# Patient Record
Sex: Male | Born: 1970 | Race: Black or African American | Hispanic: No | Marital: Single | Smoking: Current every day smoker
Health system: Southern US, Community
[De-identification: ages and names within clinical notes are randomized; demographics above are authoritative.]

## PROBLEM LIST (undated history)

## (undated) ENCOUNTER — Emergency Department (HOSPITAL_COMMUNITY): Payer: Self-pay | Source: Home / Self Care

## (undated) DIAGNOSIS — F1410 Cocaine abuse, uncomplicated: Secondary | ICD-10-CM

## (undated) DIAGNOSIS — F1210 Cannabis abuse, uncomplicated: Secondary | ICD-10-CM

## (undated) DIAGNOSIS — M19012 Primary osteoarthritis, left shoulder: Secondary | ICD-10-CM

## (undated) DIAGNOSIS — F4310 Post-traumatic stress disorder, unspecified: Secondary | ICD-10-CM

## (undated) DIAGNOSIS — F1910 Other psychoactive substance abuse, uncomplicated: Secondary | ICD-10-CM

## (undated) HISTORY — PX: ACHILLES TENDON REPAIR: SUR1153

## (undated) HISTORY — PX: HAND SURGERY: SHX662

---

## 1997-09-23 ENCOUNTER — Ambulatory Visit (HOSPITAL_COMMUNITY): Admission: RE | Admit: 1997-09-23 | Discharge: 1997-09-23 | Payer: Self-pay | Admitting: *Deleted

## 1998-12-23 ENCOUNTER — Ambulatory Visit (HOSPITAL_BASED_OUTPATIENT_CLINIC_OR_DEPARTMENT_OTHER): Admission: RE | Admit: 1998-12-23 | Discharge: 1998-12-23 | Payer: Self-pay | Admitting: Orthopedic Surgery

## 1999-10-18 ENCOUNTER — Ambulatory Visit (HOSPITAL_BASED_OUTPATIENT_CLINIC_OR_DEPARTMENT_OTHER): Admission: RE | Admit: 1999-10-18 | Discharge: 1999-10-19 | Payer: Self-pay | Admitting: Orthopaedic Surgery

## 2000-06-03 ENCOUNTER — Emergency Department (HOSPITAL_COMMUNITY): Admission: EM | Admit: 2000-06-03 | Discharge: 2000-06-03 | Payer: Self-pay | Admitting: Internal Medicine

## 2001-09-06 ENCOUNTER — Emergency Department (HOSPITAL_COMMUNITY): Admission: EM | Admit: 2001-09-06 | Discharge: 2001-09-06 | Payer: Self-pay | Admitting: Emergency Medicine

## 2006-03-27 ENCOUNTER — Emergency Department (HOSPITAL_COMMUNITY): Admission: EM | Admit: 2006-03-27 | Discharge: 2006-03-27 | Payer: Self-pay | Admitting: Emergency Medicine

## 2007-06-22 ENCOUNTER — Emergency Department (HOSPITAL_COMMUNITY): Admission: EM | Admit: 2007-06-22 | Discharge: 2007-06-22 | Payer: Self-pay | Admitting: Emergency Medicine

## 2008-11-30 ENCOUNTER — Observation Stay (HOSPITAL_COMMUNITY): Admission: EM | Admit: 2008-11-30 | Discharge: 2008-12-01 | Payer: Self-pay | Admitting: Emergency Medicine

## 2009-01-09 ENCOUNTER — Ambulatory Visit (HOSPITAL_COMMUNITY): Admission: RE | Admit: 2009-01-09 | Discharge: 2009-01-09 | Payer: Self-pay | Admitting: Otolaryngology

## 2009-01-11 ENCOUNTER — Ambulatory Visit (HOSPITAL_COMMUNITY): Admission: RE | Admit: 2009-01-11 | Discharge: 2009-01-11 | Payer: Self-pay | Admitting: Otolaryngology

## 2010-07-29 LAB — DIFFERENTIAL
Basophils Relative: 1 % (ref 0–1)
Lymphocytes Relative: 40 % (ref 12–46)
Lymphs Abs: 2.3 10*3/uL (ref 0.7–4.0)
Monocytes Absolute: 0.5 10*3/uL (ref 0.1–1.0)
Monocytes Relative: 8 % (ref 3–12)
Neutro Abs: 2.6 10*3/uL (ref 1.7–7.7)
Neutrophils Relative %: 47 % (ref 43–77)

## 2010-07-29 LAB — CBC
Hemoglobin: 14.5 g/dL (ref 13.0–17.0)
RBC: 4.77 MIL/uL (ref 4.22–5.81)
WBC: 5.7 10*3/uL (ref 4.0–10.5)
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
## 2010-09-09 NOTE — Op Note (Signed)
[place]  Patient:    Fred Wood, Fred Wood                        MRN: [id number] Proc. Date: 10/18/99 Adm. Date:  [id number] Attending:  Marcene Corning                           Operative Report  PREOPERATIVE DIAGNOSIS:  Right Achilles tendon rupture.  POSTOPERATIVE DIAGNOSIS:  Right Achilles tendon rupture.  PROCEDURE:  Right Achilles tendon repair.  SURGEON:  Lubertha Basque. Jerl Santos, M.D.  ASSISTANT:  Prince Rome, P.A.  ANESTHESIA:  General.  INDICATIONS:  The patient is a 40 year old man who was playing basketball several weeks ago when he felt a pop in his calf.  He has been unable to jump since that time and has noticed swelling and a defect in his Achilles tendon. He came in several days ago for evaluation several weeks from his injury.  He wished to have this repaired as he had the opposite side repaired last year successfully.  The procedure was discussed with the patient and informed operative consent was obtained after discussion of the possible complications of reaction to anesthesia, infection, repeat rupture, and ankle stiffness.  DESCRIPTION OF PROCEDURE:  The patient was taken to the operating suite where general anesthetic was applied with endotracheal tube without difficulty.  He was positioned prone and prepped and draped in normal sterile fashion.  After administration of preoperative antibiotics, the right leg was elevated, exsanguinated, and a tourniquet inflated about the calf.  A posteromedial longitudinal incision was made and centered over the rupture site.  Dissection was carried down to the peritenon, which was incised.  He had what appeared to be a chronic rupture with some degeneration of the tendon.  It appears that this had partially healed.  The ends of the tendon were freshened sharply with a 15 blade with some degenerative tissue removed, but no more than a couple of millimeters from either end.  The distal  portion was quite thickened.  I then passed a #2 Ethibond suture in [place] fashion through both ends of the tendon to reapproximate this tightly.  I then oversewed the repair with #1 Vicryl. Peritenon was repaired over this tendon with #0 Vicryl in interrupted fashion. The tourniquet was deflated and the foot became pink and warm immediately. Then #2 undyed Vicryl was used to reapproximate the subcutaneous tissues followed by staples in the skin.  Adaptic was placed on the wound followed by dry gauze and posterior splint of plaster just short of neutral.  The estimated blood loss and intraoperative fluids can be obtained from the records as can an adequate tourniquet time.  DISPOSITION:  The patient was extubated in the operating room and taken to the recovery room in stable condition.  Plays were for him to stay overnight for pain control with probable discharge home in the morning. DD:  10/18/99 TD:  10/19/99 Job: [id number]

## 2010-09-28 ENCOUNTER — Inpatient Hospital Stay (INDEPENDENT_AMBULATORY_CARE_PROVIDER_SITE_OTHER)
Admission: RE | Admit: 2010-09-28 | Discharge: 2010-09-28 | Disposition: A | Discharge status: Home / Self Care | Payer: 59 | Source: Ambulatory Visit | Attending: Family Medicine | Admitting: Family Medicine

## 2010-09-28 DIAGNOSIS — L989 Disorder of the skin and subcutaneous tissue, unspecified: Secondary | ICD-10-CM

## 2010-09-30 LAB — HERPES SIMPLEX VIRUS CULTURE: Culture: NOT DETECTED

## 2010-10-01 ENCOUNTER — Inpatient Hospital Stay (INDEPENDENT_AMBULATORY_CARE_PROVIDER_SITE_OTHER)
Admission: RE | Admit: 2010-10-01 | Discharge: 2010-10-01 | Disposition: A | Discharge status: Home / Self Care | Payer: 59 | Source: Ambulatory Visit | Attending: Emergency Medicine | Admitting: Emergency Medicine

## 2010-10-01 DIAGNOSIS — K120 Recurrent oral aphthae: Secondary | ICD-10-CM

## 2010-10-01 LAB — RPR: RPR Ser Ql: NONREACTIVE

## 2010-10-01 LAB — HIV ANTIBODY (ROUTINE TESTING W REFLEX): HIV: NONREACTIVE

## 2012-02-13 ENCOUNTER — Emergency Department (HOSPITAL_COMMUNITY)
Admission: EM | Admit: 2012-02-13 | Discharge: 2012-02-13 | Disposition: A | Discharge status: Home / Self Care | Payer: Self-pay | Attending: Emergency Medicine | Admitting: Emergency Medicine

## 2012-02-13 ENCOUNTER — Encounter (HOSPITAL_COMMUNITY): Payer: Self-pay | Admitting: *Deleted

## 2012-02-13 DIAGNOSIS — K047 Periapical abscess without sinus: Secondary | ICD-10-CM | POA: Insufficient documentation

## 2012-02-13 DIAGNOSIS — F17200 Nicotine dependence, unspecified, uncomplicated: Secondary | ICD-10-CM | POA: Insufficient documentation

## 2012-02-13 MED ORDER — OXYCODONE-ACETAMINOPHEN 5-325 MG PO TABS
1.0000 | ORAL_TABLET | Freq: Once | ORAL | Status: AC
  Administered 2012-02-13: 1 via ORAL
  Filled 2012-02-13: qty 1

## 2012-02-13 MED ORDER — PENICILLIN V POTASSIUM 500 MG PO TABS: 500.0000 mg | ORAL_TABLET | Freq: Three times a day (TID) | ORAL | Status: DC

## 2012-02-13 MED ORDER — HYDROCODONE-ACETAMINOPHEN 5-500 MG PO TABS: 1.0000 | ORAL_TABLET | Freq: Four times a day (QID) | ORAL | Status: DC | PRN

## 2012-02-13 MED ORDER — PENICILLIN V POTASSIUM 500 MG PO TABS
500.0000 mg | ORAL_TABLET | Freq: Once | ORAL | Status: AC
  Administered 2012-02-13: 500 mg via ORAL
  Filled 2012-02-13: qty 1
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
## 2012-02-13 NOTE — ED Notes (Signed)
Rt sided tooth pain with swelling

## 2012-02-16 NOTE — ED Provider Notes (Signed)
Medical screening examination/treatment/procedure(s) were performed by non-physician practitioner and as supervising physician I was immediately available for consultation/collaboration.   Laray Anger, DO 02/16/12 Prentice Docker

## 2013-02-23 ENCOUNTER — Emergency Department (INDEPENDENT_AMBULATORY_CARE_PROVIDER_SITE_OTHER)
Admission: EM | Admit: 2013-02-23 | Discharge: 2013-02-23 | Disposition: A | Discharge status: Home / Self Care | Payer: Self-pay | Source: Home / Self Care | Attending: Family Medicine | Admitting: Family Medicine

## 2013-02-23 ENCOUNTER — Encounter (HOSPITAL_COMMUNITY): Payer: Self-pay | Admitting: Emergency Medicine

## 2013-02-23 DIAGNOSIS — M25519 Pain in unspecified shoulder: Secondary | ICD-10-CM

## 2013-02-23 DIAGNOSIS — M25511 Pain in right shoulder: Secondary | ICD-10-CM
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
## 2014-09-07 ENCOUNTER — Ambulatory Visit (INDEPENDENT_AMBULATORY_CARE_PROVIDER_SITE_OTHER): Payer: 59

## 2014-09-07 ENCOUNTER — Ambulatory Visit (INDEPENDENT_AMBULATORY_CARE_PROVIDER_SITE_OTHER): Payer: 59 | Admitting: Family Medicine

## 2014-09-07 VITALS — BP 122/90 | HR 82 | Temp 99.6°F | Resp 18 | Ht 68.0 in | Wt 202.0 lb

## 2014-09-07 DIAGNOSIS — H1132 Conjunctival hemorrhage, left eye: Secondary | ICD-10-CM

## 2014-09-07 DIAGNOSIS — S20219A Contusion of unspecified front wall of thorax, initial encounter: Secondary | ICD-10-CM | POA: Diagnosis not present

## 2014-09-07 DIAGNOSIS — H5712 Ocular pain, left eye: Secondary | ICD-10-CM

## 2014-09-07 DIAGNOSIS — R519 Headache, unspecified: Secondary | ICD-10-CM

## 2014-09-07 DIAGNOSIS — R0781 Pleurodynia: Secondary | ICD-10-CM

## 2014-09-07 DIAGNOSIS — S0083XA Contusion of other part of head, initial encounter: Secondary | ICD-10-CM | POA: Diagnosis not present

## 2014-09-07 DIAGNOSIS — R51 Headache: Secondary | ICD-10-CM

## 2014-09-07 DIAGNOSIS — R071 Chest pain on breathing: Secondary | ICD-10-CM

## 2014-09-07 MED ORDER — OXYCODONE-ACETAMINOPHEN 5-325 MG PO TABS: 1.0000 | ORAL_TABLET | Freq: Three times a day (TID) | ORAL | Status: DC | PRN

## 2014-09-07 MED ORDER — IBUPROFEN 600 MG PO TABS: 600.0000 mg | ORAL_TABLET | Freq: Three times a day (TID) | ORAL | Status: DC | PRN

## 2014-09-07 MED ORDER — CYCLOBENZAPRINE HCL 5 MG PO TABS: 5.0000 mg | ORAL_TABLET | Freq: Three times a day (TID) | ORAL | Status: DC | PRN
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
## 2014-09-07 NOTE — Patient Instructions (Signed)

## 2015-10-24 ENCOUNTER — Emergency Department (HOSPITAL_COMMUNITY)
Admission: EM | Admit: 2015-10-24 | Discharge: 2015-10-24 | Disposition: A | Discharge status: Home / Self Care | Payer: Non-veteran care | Attending: Emergency Medicine | Admitting: Emergency Medicine

## 2015-10-24 ENCOUNTER — Encounter (HOSPITAL_COMMUNITY): Payer: Self-pay | Admitting: Emergency Medicine

## 2015-10-24 DIAGNOSIS — F10129 Alcohol abuse with intoxication, unspecified: Secondary | ICD-10-CM | POA: Insufficient documentation

## 2015-10-24 DIAGNOSIS — F17210 Nicotine dependence, cigarettes, uncomplicated: Secondary | ICD-10-CM | POA: Diagnosis not present

## 2015-10-24 DIAGNOSIS — Z79899 Other long term (current) drug therapy: Secondary | ICD-10-CM | POA: Insufficient documentation

## 2015-10-24 DIAGNOSIS — F10920 Alcohol use, unspecified with intoxication, uncomplicated: Secondary | ICD-10-CM

## 2015-10-24 DIAGNOSIS — Z791 Long term (current) use of non-steroidal anti-inflammatories (NSAID): Secondary | ICD-10-CM | POA: Diagnosis not present

## 2015-10-24 DIAGNOSIS — R4689 Other symptoms and signs involving appearance and behavior: Secondary | ICD-10-CM

## 2015-10-24 NOTE — Discharge Instructions (Signed)
Alcohol Intoxication  Alcohol intoxication occurs when the amount of alcohol that a person has consumed impairs his or her ability to mentally and physically function. Alcohol directly impairs the normal chemical activity of the brain. Drinking large amounts of alcohol can lead to changes in mental function and behavior, and it can cause many physical effects that can be harmful.   Alcohol intoxication can range in severity from mild to very severe. Various factors can affect the level of intoxication that occurs, such as the person's age, gender, weight, frequency of alcohol consumption, and the presence of other medical conditions (such as diabetes, seizures, or heart conditions). Dangerous levels of alcohol intoxication may occur when people drink large amounts of alcohol in a short period (binge drinking). Alcohol can also be especially dangerous when combined with certain prescription medicines or "recreational" drugs.  SIGNS AND SYMPTOMS  Some common signs and symptoms of mild alcohol intoxication include:  · Loss of coordination.  · Changes in mood and behavior.  · Impaired judgment.  · Slurred speech.  As alcohol intoxication progresses to more severe levels, other signs and symptoms will appear. These may include:  · Vomiting.  · Confusion and impaired memory.  · Slowed breathing.  · Seizures.  · Loss of consciousness.  DIAGNOSIS   Your health care provider will take a medical history and perform a physical exam. You will be asked about the amount and type of alcohol you have consumed. Blood tests will be done to measure the concentration of alcohol in your blood. In many places, your blood alcohol level must be lower than 80 mg/dL (0.08%) to legally drive. However, many dangerous effects of alcohol can occur at much lower levels.   TREATMENT   People with alcohol intoxication often do not require treatment. Most of the effects of alcohol intoxication are temporary, and they go away as the alcohol naturally  leaves the body. Your health care provider will monitor your condition until you are stable enough to go home. Fluids are sometimes given through an IV access tube to help prevent dehydration.   HOME CARE INSTRUCTIONS  · Do not drive after drinking alcohol.  · Stay hydrated. Drink enough water and fluids to keep your urine clear or pale yellow. Avoid caffeine.    · Only take over-the-counter or prescription medicines as directed by your health care provider.    SEEK MEDICAL CARE IF:   · You have persistent vomiting.    · You do not feel better after a few days.  · You have frequent alcohol intoxication. Your health care provider can help determine if you should see a substance use treatment counselor.  SEEK IMMEDIATE MEDICAL CARE IF:   · You become shaky or tremble when you try to stop drinking.    · You shake uncontrollably (seizure).    · You throw up (vomit) blood. This may be bright red or may look like black coffee grounds.    · You have blood in your stool. This may be bright red or may appear as a black, tarry, bad smelling stool.    · You become lightheaded or faint.    MAKE SURE YOU:   · Understand these instructions.  · Will watch your condition.  · Will get help right away if you are not doing well or get worse.     This information is not intended to replace advice given to you by your health care provider. Make sure you discuss any questions you have with your health care provider.       Document Released: 01/18/2005 Document Revised: 12/11/2012 Document Reviewed: 09/13/2012  Elsevier Interactive Patient Education ©2016 [employer]

## 2015-10-24 NOTE — ED Notes (Signed)
Patient d/c'd self care.  Reviewed d/c papers.  Patient verbalized understanding.
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
## 2017-01-13 ENCOUNTER — Encounter (HOSPITAL_COMMUNITY): Payer: Self-pay

## 2017-01-13 ENCOUNTER — Emergency Department (HOSPITAL_COMMUNITY)
Admission: EM | Admit: 2017-01-13 | Discharge: 2017-01-15 | Disposition: A | Discharge status: Home / Self Care | Payer: Self-pay | Attending: Emergency Medicine | Admitting: Emergency Medicine

## 2017-01-13 DIAGNOSIS — F17210 Nicotine dependence, cigarettes, uncomplicated: Secondary | ICD-10-CM | POA: Insufficient documentation

## 2017-01-13 DIAGNOSIS — F1910 Other psychoactive substance abuse, uncomplicated: Secondary | ICD-10-CM

## 2017-01-13 DIAGNOSIS — F1010 Alcohol abuse, uncomplicated: Secondary | ICD-10-CM | POA: Insufficient documentation

## 2017-01-13 DIAGNOSIS — F12922 Cannabis use, unspecified with intoxication with perceptual disturbance: Secondary | ICD-10-CM

## 2017-01-13 DIAGNOSIS — F22 Delusional disorders: Secondary | ICD-10-CM

## 2017-01-13 DIAGNOSIS — F14150 Cocaine abuse with cocaine-induced psychotic disorder with delusions: Secondary | ICD-10-CM | POA: Insufficient documentation

## 2017-01-13 DIAGNOSIS — F1290 Cannabis use, unspecified, uncomplicated: Secondary | ICD-10-CM | POA: Insufficient documentation

## 2017-01-13 HISTORY — DX: Cannabis abuse, uncomplicated: F12.10

## 2017-01-13 HISTORY — DX: Other psychoactive substance abuse, uncomplicated: F19.10

## 2017-01-13 HISTORY — DX: Cocaine abuse, uncomplicated: F14.10

## 2017-01-13 HISTORY — DX: Post-traumatic stress disorder, unspecified: F43.10

## 2017-01-13 HISTORY — DX: Primary osteoarthritis, left shoulder: M19.012

## 2017-01-13 LAB — RAPID URINE DRUG SCREEN, HOSP PERFORMED
Amphetamines: NOT DETECTED
BENZODIAZEPINES: NOT DETECTED
Barbiturates: NOT DETECTED
COCAINE: POSITIVE — AB
OPIATES: NOT DETECTED
Tetrahydrocannabinol: POSITIVE — AB

## 2017-01-13 LAB — COMPREHENSIVE METABOLIC PANEL
ALK PHOS: 46 U/L (ref 38–126)
ALT: 17 U/L (ref 17–63)
AST: 23 U/L (ref 15–41)
Albumin: 4.1 g/dL (ref 3.5–5.0)
Anion gap: 10 (ref 5–15)
BUN: 6 mg/dL (ref 6–20)
CALCIUM: 9.1 mg/dL (ref 8.9–10.3)
CO2: 27 mmol/L (ref 22–32)
CREATININE: 1.38 mg/dL — AB (ref 0.61–1.24)
Chloride: 99 mmol/L — ABNORMAL LOW (ref 101–111)
Glucose, Bld: 84 mg/dL (ref 65–99)
Potassium: 3.7 mmol/L (ref 3.5–5.1)
Sodium: 136 mmol/L (ref 135–145)
Total Bilirubin: 1 mg/dL (ref 0.3–1.2)
Total Protein: 7.4 g/dL (ref 6.5–8.1)

## 2017-01-13 LAB — ETHANOL

## 2017-01-13 LAB — CBC
HCT: 50.5 % (ref 39.0–52.0)
Hemoglobin: 17.3 g/dL — ABNORMAL HIGH (ref 13.0–17.0)
MCH: 29.9 pg (ref 26.0–34.0)
MCHC: 34.3 g/dL (ref 30.0–36.0)
MCV: 87.4 fL (ref 78.0–100.0)
PLATELETS: 345 10*3/uL (ref 150–400)
RBC: 5.78 MIL/uL (ref 4.22–5.81)
RDW: 15 % (ref 11.5–15.5)
WBC: 8 10*3/uL (ref 4.0–10.5)

## 2017-01-13 MED ORDER — ONDANSETRON HCL 4 MG PO TABS: 4.0000 mg | ORAL_TABLET | Freq: Three times a day (TID) | ORAL | Status: DC | PRN

## 2017-01-13 MED ORDER — ZOLPIDEM TARTRATE 5 MG PO TABS: 5.0000 mg | ORAL_TABLET | Freq: Every evening | ORAL | Status: DC | PRN

## 2017-01-13 MED ORDER — ALUM & MAG HYDROXIDE-SIMETH 200-200-20 MG/5ML PO SUSP: 30.0000 mL | Freq: Four times a day (QID) | ORAL | Status: DC | PRN

## 2017-01-13 MED ORDER — LORAZEPAM 1 MG PO TABS
1.0000 mg | ORAL_TABLET | Freq: Once | ORAL | Status: AC
  Administered 2017-01-13: 1 mg via ORAL
  Filled 2017-01-13: qty 1

## 2017-01-13 MED ORDER — NICOTINE 21 MG/24HR TD PT24
21.0000 mg | MEDICATED_PATCH | Freq: Every day | TRANSDERMAL | Status: DC
  Administered 2017-01-14: 21 mg via TRANSDERMAL
  Filled 2017-01-13: qty 1

## 2017-01-13 MED ORDER — ACETAMINOPHEN 325 MG PO TABS
650.0000 mg | ORAL_TABLET | ORAL | Status: DC | PRN
  Administered 2017-01-14: 650 mg via ORAL
  Filled 2017-01-13: qty 2

## 2017-01-13 MED ORDER — LORAZEPAM 1 MG PO TABS
1.0000 mg | ORAL_TABLET | ORAL | Status: DC | PRN
Start: 2017-01-13 — End: 2017-01-15

## 2017-01-13 MED ORDER — ZIPRASIDONE MESYLATE 20 MG IM SOLR: 20.0000 mg | Freq: Two times a day (BID) | INTRAMUSCULAR | Status: DC | PRN
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
## 2017-01-13 NOTE — ED Notes (Addendum)
Pt moved to [facility]; staffing called for sitter. Pt awakened briefly to ambulate from stretcher to room.  Pt followed commands for  VS.  When asked if he was having tremors, he said he didn't know, maybe yes.  When this RN attempted to continue CIWA/COWS assessment, pt went back to sleep.
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
## 2017-01-13 NOTE — Discharge Instructions (Addendum)
Substance Use Disorder Substance use disorder happens when a person's repeated use of drugs or alcohol interferes with his or her ability to be productive. This disorder can cause problems with your mental and physical health. It can affect your ability to have healthy relationships, and it can keep you from being able to meet your responsibilities at work, home, or school. It can also lead to addiction. The most commonly abused substances include:  Alcohol.  Tobacco.  Marijuana.  Stimulants, such as cocaine and methamphetamine.  Hallucinogens, such as LSD and PCP.  Opioids, such as some prescription pain medicines and heroin.  What are the causes? This condition may develop due to social, psychological, or physical reasons. Causes include:  Stress.  Abuse.  Peer pressure.  Anxiety.  Depression.  What increases the risk? This condition is more likely to develop in people who:  Are stressed.  Have been abused.  Have a mental health disorder, such as depression.  Are born with certain genes.  What are the signs or symptoms? Symptoms of this condition include:  Using the substance for longer periods of time or at a higher dosage than what is normal or intended.  Having a lasting desire to use the substance.  Being unable to slow down or stop your use of the substance.  Spending an abnormal amount of time seeking the substance, using the substance, or recovering from using the substance.  Craving the substance.  Substance use that: ? Interferes with your work, school, or home life. ? Interferes with your personal and social relationships. ? Makes you give up activities that you once enjoyed or found important.  Using the substance even though: ? You know it is dangerous or bad for your health. ? You know it is causing problems in your life.  Needing more and more of the substance to get the same effect (developing tolerance).  Experiencing physical symptoms  if you do not use the substance (withdrawal).  Using the substance to avoid withdrawal.  How is this diagnosed? This condition may be diagnosed based on:  Your history of substance use.  The way in which substance abuse affects your life.  Having at least two symptoms of substance use disorder within a 68-month period.  Your health care provider may also test your blood and urine for alcohol and drugs. How is this treated? This condition may be treated by:  Stopping substance use safely. This may require taking medicines and being closely observed for several days.  Taking part in group and individual counseling from mental health providers who help people with substance use disorder.  Staying at a residential treatment center for several days or weeks.  Attending daily counseling sessions at a treatment center.  Taking medicine as told by your health care provider: ? To ease symptoms and prevent complications during withdrawal. ? To treat other mental health issues, such as depression or anxiety. ? To block cravings by causing the same effects as the substance. ? To block the effects of the substance or replace good sensations with unpleasant ones.  Going to a support group to share your experience with others who are going through the same thing. These groups are an important part of long-term recovery for many people. They include 12-step programs like Alcoholics Anonymous and Narcotics Anonymous.  Recovery can be a long process. Many people who undergo treatment start using the substance again after stopping. This is called a relapse. If you have a relapse, that does not mean  that treatment will not work. Follow these instructions at home:  Take over-the-counter and prescription medicines only as told by your health care provider.  Do not use any drugs or alcohol.  Keep all follow-up visits as told by your health care provider. This is important. This includes continuing  to work with therapists, health care providers, and support groups. Contact a health care provider if:  You cannot take your medicines as told.  Your symptoms get worse.  You have trouble resisting the urge to use drugs or alcohol. Get help right away if:  You have serious thoughts about hurting yourself or someone else.  You have a relapse. This information is not intended to replace advice given to you by your health care provider. Make sure you discuss any questions you have with your health care provider. Document Released: 11/30/2004 Document Revised: 12/08/2015 Document Reviewed: 04/15/2014 Elsevier Interactive Patient Education  2018 [employer].  Substance Abuse Testing Why am I having this test? Substance abuse testing is done to identify the presence of drugs in the body. It may also be done to:  Help guide treatment if you have seizures.  Measure the levels of certain medicines in your body, including anabolic steroids, stimulants, diuretics, and lithium.  Substance abuse testing is most often used by employers or Patent examiner agencies to identify whether a person has used illegal drugs, such as cocaine, amphetamines, and marijuana. What kind of sample is taken? Your health care provider may collect at least one of the following to perform the test:  Urine. A urine sample is collected in a sterile container given to you by the lab. If you are asked to provide a urine sample, do not alter it in any way.  Hair. A sample of hair requires cutting 50 strands of hair from your scalp.  Blood. A blood sample is usually collected by inserting a needle into a vein.  How do I prepare for this test? You may be asked to provide a list of your current prescription medicines. If the test is required for employment or legal reasons, you will be asked to give consent for the test. There is no other preparation required. How are the results reported? Your test results will be  reported as either positive or negative. It may be your responsibility to obtain your test results. Ask the lab or department performing the test when and how you will get your results. What do the results mean? A negative result means no drugs were found. A positive result may mean you have recently used drugs. If your result is positive, more testing will be done to confirm the presence of drugs. Talk with your health care provider to discuss your results, treatment options, and if necessary, the need for more tests. Talk with your health care provider if you have any questions about your results. This information is not intended to replace advice given to you by your health care provider. Make sure you discuss any questions you have with your health care provider. Document Released: 04/10/2005 Document Revised: 12/08/2015 Document Reviewed: 09/05/2013 Elsevier Interactive Patient Education  [employer].

## 2017-01-14 ENCOUNTER — Encounter (HOSPITAL_COMMUNITY): Payer: Self-pay | Admitting: *Deleted

## 2017-01-14 NOTE — ED Notes (Signed)
Offered pt to shower x 2 - declined. Lying on bed watching tv.

## 2017-01-14 NOTE — BH Assessment (Signed)
Reassessment: When prompted about what brought him to the ED patient stated "I told them yesterday, they didn't write that down!" Patient presenting irritable during assessment. Patient denies SI, HI and AVH at this time. Clinician explained to patient that inpatient was recommended for him at this time and Disposition was seeking a bed with the [place]. Patient stated "I don't want to go to inpatient." When asked what he would like patient stated "I just want to get help for my hallucinations." Clinician explained that the best place to stabilize his medications and reduce recurrence of hallucinations was inpatient. Patient stated "I don't want it." Clinician stated she would share his concerns with the NP.   Nira Retort, MSW, LCSW Triage Specialist [phone],

## 2017-01-14 NOTE — ED Notes (Signed)
Medical Clearance Pt Policy form given to pt - voiced understanding. Pt aware his friend, Jasmine December, advised she is coming to visit at 1230.

## 2017-01-14 NOTE — ED Notes (Signed)
Pt's friend, Jasmine December, visiting w/pt.

## 2017-01-14 NOTE — ED Notes (Signed)
Dallie Piles, RN, Consulting civil engineer aware no sitter available. RN observing.

## 2017-01-14 NOTE — ED Notes (Signed)
Pt asking for door to be closed - advised pt unable to do so for safety. Voiced understanding.

## 2017-01-14 NOTE — ED Notes (Signed)
IVC paperwork completed by Dr Juleen China - faxed to [facility] - called and verified receipt.
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
## 2017-01-14 NOTE — ED Notes (Signed)
Copy of IVC papers faxed to [facility], copy sent to Medical Records, original placed in folder for Magistrate, and all 3 sets on clipboard.  

## 2017-01-14 NOTE — ED Notes (Signed)
Pt's friend leaving at this time. Requests for pt to receive double portions d/t "he is an eater and fixes himself full course meals w/no leftovers".

## 2017-01-14 NOTE — ED Notes (Addendum)
Pt's friend, Jasmine December, called - advised her pt is sleeping at this time and offered to advise him of phone call when he awakens - advised she will visit pt at 1230-1300 visitation time.

## 2017-01-14 NOTE — ED Notes (Signed)
Pt's friend, Jasmine December, leaving at this time - wrote numbers pt had requested on a piece of paper - placed on bedside table for pt. Pt asking if his cell phone and keys may be left in ED - recommended for pt to allow Jasmine December to keep so no risk of getting lost. Both voiced agreement.

## 2017-01-14 NOTE — ED Notes (Signed)
Pt had coughing spell.  Given drink by sitter.

## 2017-01-15 DIAGNOSIS — F1910 Other psychoactive substance abuse, uncomplicated: Secondary | ICD-10-CM

## 2017-01-15 DIAGNOSIS — F17210 Nicotine dependence, cigarettes, uncomplicated: Secondary | ICD-10-CM

## 2017-01-15 DIAGNOSIS — F12922 Cannabis use, unspecified with intoxication with perceptual disturbance: Secondary | ICD-10-CM
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
## 2017-01-15 NOTE — ED Notes (Signed)
Pt stat he understands instructions and will return for any worsening of symptoms or new problems. Ome stable with steady gait.

## 2017-01-15 NOTE — ED Notes (Signed)
TTS in progress 

## 2017-01-15 NOTE — Progress Notes (Signed)
Per Fransisca Kaufmann, NP, the patient does not meet criteria for inpatient treatment. The patient is recommended for discharge and to continue to follow up with his psychiatrist with [facility].   The patient is Psych Cleared.   Patient reports he has an appointment with his psychiatrist on January 23, 2017.    Ermalinda Memos, RN notified.      Baldo Daub MSW, LCSWA CSW Disposition [phone]

## 2017-01-15 NOTE — ED Notes (Signed)
Pt statse to hold nicotene patch till after shower.

## 2017-01-15 NOTE — ED Notes (Signed)
Confirmed through courthouse supervisor that Notice of Commitment change arrived at [employer].

## 2019-11-10 ENCOUNTER — Encounter (HOSPITAL_COMMUNITY): Payer: Self-pay | Admitting: *Deleted

## 2019-11-10 ENCOUNTER — Inpatient Hospital Stay (HOSPITAL_COMMUNITY): Payer: No Typology Code available for payment source

## 2019-11-10 ENCOUNTER — Emergency Department (HOSPITAL_COMMUNITY): Payer: No Typology Code available for payment source

## 2019-11-10 ENCOUNTER — Inpatient Hospital Stay (HOSPITAL_COMMUNITY)
Admission: EM | Admit: 2019-11-10 | Discharge: 2019-11-12 | DRG: 917 | Disposition: A | Discharge status: Home / Self Care | Payer: No Typology Code available for payment source | Attending: Internal Medicine | Admitting: Internal Medicine

## 2019-11-10 ENCOUNTER — Other Ambulatory Visit: Payer: Self-pay

## 2019-11-10 DIAGNOSIS — E785 Hyperlipidemia, unspecified: Secondary | ICD-10-CM | POA: Diagnosis present

## 2019-11-10 DIAGNOSIS — I6381 Other cerebral infarction due to occlusion or stenosis of small artery: Secondary | ICD-10-CM | POA: Diagnosis present

## 2019-11-10 DIAGNOSIS — F17210 Nicotine dependence, cigarettes, uncomplicated: Secondary | ICD-10-CM | POA: Diagnosis present

## 2019-11-10 DIAGNOSIS — M19012 Primary osteoarthritis, left shoulder: Secondary | ICD-10-CM | POA: Diagnosis present

## 2019-11-10 DIAGNOSIS — F1410 Cocaine abuse, uncomplicated: Secondary | ICD-10-CM | POA: Diagnosis present

## 2019-11-10 DIAGNOSIS — F1910 Other psychoactive substance abuse, uncomplicated: Secondary | ICD-10-CM

## 2019-11-10 DIAGNOSIS — F4310 Post-traumatic stress disorder, unspecified: Secondary | ICD-10-CM | POA: Diagnosis present

## 2019-11-10 DIAGNOSIS — G8194 Hemiplegia, unspecified affecting left nondominant side: Secondary | ICD-10-CM | POA: Diagnosis present

## 2019-11-10 DIAGNOSIS — T405X1A Poisoning by cocaine, accidental (unintentional), initial encounter: Secondary | ICD-10-CM | POA: Diagnosis present

## 2019-11-10 DIAGNOSIS — F1210 Cannabis abuse, uncomplicated: Secondary | ICD-10-CM | POA: Diagnosis present

## 2019-11-10 DIAGNOSIS — I6389 Other cerebral infarction: Secondary | ICD-10-CM | POA: Diagnosis not present

## 2019-11-10 DIAGNOSIS — Z20822 Contact with and (suspected) exposure to covid-19: Secondary | ICD-10-CM | POA: Diagnosis present

## 2019-11-10 DIAGNOSIS — I639 Cerebral infarction, unspecified: Secondary | ICD-10-CM

## 2019-11-10 DIAGNOSIS — G43109 Migraine with aura, not intractable, without status migrainosus: Secondary | ICD-10-CM | POA: Diagnosis not present

## 2019-11-10 DIAGNOSIS — R7303 Prediabetes: Secondary | ICD-10-CM | POA: Diagnosis present

## 2019-11-10 LAB — I-STAT CHEM 8, ED
BUN: 9 mg/dL (ref 6–20)
Calcium, Ion: 1.06 mmol/L — ABNORMAL LOW (ref 1.15–1.40)
Chloride: 105 mmol/L (ref 98–111)
Creatinine, Ser: 1 mg/dL (ref 0.61–1.24)
Glucose, Bld: 131 mg/dL — ABNORMAL HIGH (ref 70–99)
HCT: 47 % (ref 39.0–52.0)
Hemoglobin: 16 g/dL (ref 13.0–17.0)
Potassium: 3.7 mmol/L (ref 3.5–5.1)
Sodium: 140 mmol/L (ref 135–145)
TCO2: 26 mmol/L (ref 22–32)

## 2019-11-10 LAB — DIFFERENTIAL
Abs Immature Granulocytes: 0.07 10*3/uL (ref 0.00–0.07)
Basophils Absolute: 0.1 10*3/uL (ref 0.0–0.1)
Basophils Relative: 1 %
Eosinophils Absolute: 0.2 10*3/uL (ref 0.0–0.5)
Eosinophils Relative: 2 %
Immature Granulocytes: 1 %
Lymphocytes Relative: 29 %
Lymphs Abs: 2.4 10*3/uL (ref 0.7–4.0)
Monocytes Absolute: 0.7 10*3/uL (ref 0.1–1.0)
Monocytes Relative: 8 %
Neutro Abs: 5 10*3/uL (ref 1.7–7.7)
Neutrophils Relative %: 59 %

## 2019-11-10 LAB — CBC
HCT: 46.9 % (ref 39.0–52.0)
Hemoglobin: 14.8 g/dL (ref 13.0–17.0)
MCH: 28.5 pg (ref 26.0–34.0)
MCHC: 31.6 g/dL (ref 30.0–36.0)
MCV: 90.2 fL (ref 80.0–100.0)
Platelets: 350 10*3/uL (ref 150–400)
RBC: 5.2 MIL/uL (ref 4.22–5.81)
RDW: 15.9 % — ABNORMAL HIGH (ref 11.5–15.5)
WBC: 8.4 10*3/uL (ref 4.0–10.5)
nRBC: 0 % (ref 0.0–0.2)

## 2019-11-10 LAB — COMPREHENSIVE METABOLIC PANEL
ALT: 15 U/L (ref 0–44)
AST: 20 U/L (ref 15–41)
Albumin: 3.4 g/dL — ABNORMAL LOW (ref 3.5–5.0)
Alkaline Phosphatase: 47 U/L (ref 38–126)
Anion gap: 8 (ref 5–15)
BUN: 9 mg/dL (ref 6–20)
CO2: 25 mmol/L (ref 22–32)
Calcium: 8.4 mg/dL — ABNORMAL LOW (ref 8.9–10.3)
Chloride: 106 mmol/L (ref 98–111)
Creatinine, Ser: 1.06 mg/dL (ref 0.61–1.24)
GFR calc Af Amer: >60 mL/min (ref >60)
GFR calc non Af Amer: >60 mL/min (ref >60)
Glucose, Bld: 130 mg/dL — ABNORMAL HIGH (ref 70–99)
Potassium: 3.8 mmol/L (ref 3.5–5.1)
Sodium: 139 mmol/L (ref 135–145)
Total Bilirubin: 0.8 mg/dL (ref 0.3–1.2)
Total Protein: 6.7 g/dL (ref 6.5–8.1)

## 2019-11-10 LAB — APTT: aPTT: 26 seconds (ref 24–36)

## 2019-11-10 LAB — PROTIME-INR
INR: 1 (ref 0.8–1.2)
Prothrombin Time: 12.3 seconds (ref 11.4–15.2)

## 2019-11-10 LAB — CBG MONITORING, ED: Glucose-Capillary: 120 mg/dL — ABNORMAL HIGH (ref 70–99)

## 2019-11-10 MED ORDER — NAPHAZOLINE-PHENIRAMINE 0.025-0.3 % OP SOLN
1.0000 [drp] | Freq: Four times a day (QID) | OPHTHALMIC | Status: DC | PRN
  Filled 2019-11-10: qty 15

## 2019-11-10 MED ORDER — ACETAMINOPHEN 325 MG PO TABS
650.0000 mg | ORAL_TABLET | ORAL | Status: DC | PRN
  Administered 2019-11-12: 650 mg via ORAL
  Filled 2019-11-10: qty 2

## 2019-11-10 MED ORDER — ACETAMINOPHEN 650 MG RE SUPP: 650.0000 mg | RECTAL | Status: DC | PRN

## 2019-11-10 MED ORDER — SODIUM CHLORIDE 0.9% FLUSH: 3.0000 mL | Freq: Once | INTRAVENOUS | Status: DC

## 2019-11-10 MED ORDER — ACETAMINOPHEN 160 MG/5ML PO SOLN: 650.0000 mg | ORAL | Status: DC | PRN

## 2019-11-10 MED ORDER — METOCLOPRAMIDE HCL 5 MG/ML IJ SOLN
10.0000 mg | Freq: Once | INTRAMUSCULAR | Status: AC
  Administered 2019-11-10: 10 mg via INTRAVENOUS
  Filled 2019-11-10: qty 2

## 2019-11-10 MED ORDER — ASPIRIN 81 MG PO CHEW: 324.0000 mg | CHEWABLE_TABLET | Freq: Every day | ORAL | Status: DC

## 2019-11-10 MED ORDER — SODIUM CHLORIDE 0.9 % IV SOLN: INTRAVENOUS | Status: DC

## 2019-11-10 MED ORDER — HEPARIN SODIUM (PORCINE) 5000 UNIT/ML IJ SOLN
5000.0000 [IU] | Freq: Three times a day (TID) | INTRAMUSCULAR | Status: DC
  Administered 2019-11-10 – 2019-11-12 (×5): 5000 [IU] via SUBCUTANEOUS
  Filled 2019-11-10 (×5): qty 1

## 2019-11-10 MED ORDER — CLOPIDOGREL BISULFATE 300 MG PO TABS
300.0000 mg | ORAL_TABLET | Freq: Once | ORAL | Status: AC
  Administered 2019-11-10: 300 mg via ORAL
  Filled 2019-11-10: qty 1

## 2019-11-10 MED ORDER — PANTOPRAZOLE SODIUM 40 MG PO TBEC
40.0000 mg | DELAYED_RELEASE_TABLET | Freq: Every day | ORAL | Status: DC
  Administered 2019-11-11 – 2019-11-12 (×2): 40 mg via ORAL
  Filled 2019-11-10 (×2): qty 1

## 2019-11-10 MED ORDER — ASPIRIN EC 81 MG PO TBEC
81.0000 mg | DELAYED_RELEASE_TABLET | Freq: Every day | ORAL | Status: DC
  Administered 2019-11-11 – 2019-11-12 (×2): 81 mg via ORAL
  Filled 2019-11-10 (×3): qty 1

## 2019-11-10 MED ORDER — IOHEXOL 350 MG/ML SOLN
75.0000 mL | Freq: Once | INTRAVENOUS | Status: AC | PRN
  Administered 2019-11-10: 75 mL via INTRAVENOUS

## 2019-11-10 MED ORDER — CLOPIDOGREL BISULFATE 75 MG PO TABS
75.0000 mg | ORAL_TABLET | Freq: Every day | ORAL | Status: DC
  Administered 2019-11-11 – 2019-11-12 (×2): 75 mg via ORAL
  Filled 2019-11-10 (×2): qty 1

## 2019-11-10 MED ORDER — STROKE: EARLY STAGES OF RECOVERY BOOK
Freq: Once | Status: DC
  Filled 2019-11-10: qty 1

## 2019-11-10 NOTE — ED Notes (Signed)
Pt went to MRI.

## 2019-11-10 NOTE — ED Triage Notes (Signed)
Pt reports laying in bed approx 30 mins ago and had onset of headache with numbness/tingling down entire left side. Grip is weaker on left, no arm drift, no facial droop noted.

## 2019-11-10 NOTE — ED Provider Notes (Signed)
MSE was initiated and I personally evaluated the patient and placed orders (if any) at  8:28 PM on November 10, 2019.  I examined the patient in triage given concern for possible stroke.  On my examination, patient states that he was comfortably lying in bed watching kung fu when he suddenly developed a tingling and numbness sensation that moved down the entire left side of his body.  He also reports that his left arm and left leg now feel weak as a result.  He exhibits diminished left-sided grip strength in left leg flexion against resistance on exam.  No facial asymmetries.  PERRL and EOM intact.  While his response to pinching is intact throughout, he notes that light touch feels diminished compared to contralateral side.  Patient has a history of polysubstance use disorder and cocaine use this past weekend.  He states that this has never happened before.  He was not lying on his side when the symptoms occurred, rather flat on his back.  Patient also endorses significant disequilibrium when attempting to walk and feels as though he lists to the left.   Given his history and weakness/diminished sensation on exam, will initiate code stroke.    Lorelee New, PA-C 11/10/19 2028    Tilden Fossa, MD 11/11/19 [phone]

## 2019-11-10 NOTE — ED Notes (Signed)
Pt transported to MRI 

## 2019-11-10 NOTE — Consult Note (Signed)
Neurology Consultation Reason for Consult: Left-sided numbness Referring Physician: Gardner Candle  CC: Left-sided numbness  History is obtained from: Patient  HPI: Fred Wood is a 49 y.o. male with a history of cocaine use, PTSD presents with left-sided numbness that started abruptly sometime around seven 30-7 40 5 PM.  He states that he noticed some tingling that started on his left forehead and gradually spread down to encompass the entire left side of his body.  He also noticed left-sided headache.  Though he has never had tingling with previous headaches, he does get spots in his vision from time to time with headaches.  He presented to the emergency department where code stroke was called given the acute symptoms.   LKW: 7:30 PM tpa given?: no, mild symptoms    ROS: A 14 point ROS was performed and is negative except as noted in the HPI.   Past Medical History:  Diagnosis Date  . Cocaine abuse (HCC)   . Marijuana abuse   . Osteoarthritis of left shoulder   . Polysubstance abuse (HCC)   . PTSD (post-traumatic stress disorder)      Family History  Problem Relation Age of Onset  . Diabetes Mother      Social History:  reports that he has been smoking cigarettes. He has a 5.00 pack-year smoking history. He has never used smokeless tobacco. He reports current alcohol use. He reports that he does not use drugs.   Exam: Current vital signs: BP (!) 145/90 (BP Location: Right Arm)   Pulse 74   Temp 98.4 F (36.9 C) (Oral)   Resp 18   SpO2 97%  Vital signs in last 24 hours: Temp:  [98.4 F (36.9 C)] 98.4 F (36.9 C) (07/19 2003) Pulse Rate:  [74] 74 (07/19 2003) Resp:  [18] 18 (07/19 2003) BP: (145)/(90) 145/90 (07/19 2003) SpO2:  [97 %] 97 % (07/19 2003)   Physical Exam  Constitutional: Appears well-developed and well-nourished.  Psych: Affect appropriate to situation Eyes: No scleral injection HENT: No OP obstrucion MSK: no joint deformities.   Cardiovascular: Normal rate and regular rhythm.  Respiratory: Effort normal, non-labored breathing GI: Soft.  No distension. There is no tenderness.  Skin: WDI  Neuro: Mental Status: Patient is awake, alert, oriented to person, place, month, year, and situation. Patient is able to give a clear and coherent history. No signs of aphasia or neglect Cranial Nerves: II: Visual Fields are full. Pupils are equal, round, and reactive to light.   III,IV, VI: EOMI without ptosis or diploplia.  V: Facial sensation is diminished on the left face VII: Facial movement is symmetric.  VIII: hearing is intact to voice X: Uvula elevates symmetrically XI: Shoulder shrug is symmetric. XII: tongue is midline without atrophy or fasciculations.  Motor: Tone is normal. Bulk is normal. 5/5 strength was present on the right, he has at least 4+/5 strength in his left arm and leg, though he does not give consistent effort.  He has no drift in either arm or leg. Sensory: Sensation is diminished on the left Cerebellar: FNF intact bilaterally      I have reviewed labs in epic and the results pertinent to this consultation are: Normal creatinine  I have reviewed the images obtained: CT head-negative  Impression: 49 year old male with left-sided paresthesias in the setting of headache.  Given the presence of positive symptoms and history of spots with previous headaches, I think the most likely diagnosis will be complicated migraine, however small ischemic  infarct is possible.  I discussed IV TPA with the patient, but given how mild his symptoms are, I would not recommend at the current time he expressed understanding.  Recommendations: 1) MRI brain, if negative treat as complicated migraine.   Ritta Slot, MD [facility] [phone]  If 7pm- 7am, please page neurology on call as listed in [facility].

## 2019-11-10 NOTE — Progress Notes (Signed)
MRI with posterior circulation strokes.   - HgbA1c, fasting lipid panel - Frequent neuro checks - Echocardiogram - CTA head and neck.  - Prophylactic therapy-Antiplatelet med: Aspirin - dose 81mg  and plavix 75mg  after 300 mg load - Risk factor modification - Telemetry monitoring - PT consult, OT consult, Speech consult - Stroke team to follow  , MD [facility] [phone]  If 7pm- 7am, please page neurology on call as listed in [facility]ION.

## 2019-11-10 NOTE — ED Provider Notes (Signed)
[facility] EMERGENCY DEPARTMENT Provider Note   CSN: [id number] Arrival date & time: 11/10/19  1953     History Chief Complaint  Patient presents with  . Code Stroke    Fred Wood is a 49 y.o. male.  He presented to triage with complaint of acute onset of left-sided numbness tingling head arm and leg that started 30 minutes prior to arrival.  He was activated for code stroke from triage.  No difficulty with vision no difficulty with speech.  Feels maybe is a little weak on the left side.  No prior history of same.  On review of prior medical history has a history of polysubstance abuse including cocaine.  The history is provided by the patient.  Cerebrovascular Accident This is a new problem. The current episode started less than 1 hour ago. The problem has not changed since onset.Associated symptoms include headaches. Pertinent negatives include no chest pain, no abdominal pain and no shortness of breath. Nothing aggravates the symptoms. Nothing relieves the symptoms. He has tried nothing for the symptoms. The treatment provided no relief.       Past Medical History:  Diagnosis Date  . Cocaine abuse (HCC)   . Marijuana abuse   . Osteoarthritis of left shoulder   . Polysubstance abuse (HCC)   . PTSD (post-traumatic stress disorder)     Patient Active Problem List   Diagnosis Date Noted  . Marijuana intoxication, with perceptual disturbance (HCC) 01/15/2017    Past Surgical History:  Procedure Laterality Date  . ACHILLES TENDON REPAIR    . HAND SURGERY         Family History  Problem Relation Age of Onset  . Diabetes Mother     Social History   Tobacco Use  . Smoking status: Current Every Day Smoker    Packs/day: 1.00    Years: 5.00    Pack years: 5.00    Types: Cigarettes  . Smokeless tobacco: Never Used  Substance Use Topics  . Alcohol use: Yes    Alcohol/week: 0.0 standard drinks  . Drug use: No    Home Medications Prior to  Admission medications   Medication Sig Start Date End Date Taking? Authorizing Provider  acetaminophen (TYLENOL) 500 MG tablet Take 1,000 mg by mouth every 6 (six) hours as needed for headache (pain).    [provider]  ibuprofen (ADVIL,MOTRIN) 200 MG tablet Take 600-800 mg by mouth every 6 (six) hours as needed for headache (pain).     [provider]  naphazoline-pheniramine (NAPHCON-A) 0.025-0.3 % ophthalmic solution Place 1 drop into both eyes 4 (four) times daily as needed for eye irritation or allergies.     [provider]    Allergies    Patient has no known allergies.  Review of Systems   Review of Systems  Constitutional: Negative for fever.  HENT: Negative for sore throat.   Eyes: Negative for visual disturbance.  Respiratory: Negative for shortness of breath.   Cardiovascular: Negative for chest pain.  Gastrointestinal: Negative for abdominal pain.  Genitourinary: Negative for dysuria.  Musculoskeletal: Negative for neck pain.  Skin: Negative for rash.  Neurological: Positive for numbness and headaches.    Physical Exam Updated Vital Signs BP (!) 145/90 (BP Location: Right Arm)   Pulse 74   Temp 98.4 F (36.9 C) (Oral)   Resp 18   SpO2 97%   Physical Exam Vitals and nursing note reviewed.  Constitutional:      General: He  is not in acute distress.    Appearance: Normal appearance. He is well-developed.  HENT:     Head: Normocephalic and atraumatic.  Eyes:     Conjunctiva/sclera: Conjunctivae normal.  Cardiovascular:     Rate and Rhythm: Normal rate and regular rhythm.     Heart sounds: No murmur heard.   Pulmonary:     Effort: Pulmonary effort is normal. No respiratory distress.     Breath sounds: Normal breath sounds.  Abdominal:     Palpations: Abdomen is soft.     Tenderness: There is no abdominal tenderness.  Musculoskeletal:        General: No deformity or signs of injury. Normal range of motion.     Cervical back:  Neck supple.  Skin:    General: Skin is warm and dry.     Capillary Refill: Capillary refill takes less than 2 seconds.  Neurological:     Mental Status: He is alert.     Cranial Nerves: No cranial nerve deficit.     Sensory: Sensory deficit present.     Motor: No weakness.     Comments: Cranial nerves with normal speech no asymmetry extraocular movements intact.  Strength is 5/5 right upper and right lower.  4+/5 left upper and lower.  Sensation diminished left face left arm left leg.  Gait not tested although patient did stagger somewhat on transfer from stretcher to CAT scan table.     ED Results / Procedures / Treatments   Labs (all labs ordered are listed, but only abnormal results are displayed) Labs Reviewed  CBC - Abnormal; Notable for the following components:      Result Value   RDW 15.9 (*)    All other components within normal limits  COMPREHENSIVE METABOLIC PANEL - Abnormal; Notable for the following components:   Glucose, Bld 130 (*)    Calcium 8.4 (*)    Albumin 3.4 (*)    All other components within normal limits  RAPID URINE DRUG SCREEN, HOSP PERFORMED - Abnormal; Notable for the following components:   Cocaine POSITIVE (*)    Tetrahydrocannabinol POSITIVE (*)    All other components within normal limits  HEMOGLOBIN A1C - Abnormal; Notable for the following components:   Hgb A1c MFr Bld 6.1 (*)    All other components within normal limits  LIPID PANEL - Abnormal; Notable for the following components:   Triglycerides 156 (*)    HDL 39 (*)    All other components within normal limits  CBC - Abnormal; Notable for the following components:   RDW 15.9 (*)    All other components within normal limits  COMPREHENSIVE METABOLIC PANEL - Abnormal; Notable for the following components:   Glucose, Bld 102 (*)    Calcium 8.1 (*)    Total Protein 5.8 (*)    Albumin 3.0 (*)    All other components within normal limits  I-STAT CHEM 8, ED - Abnormal; Notable for the  following components:   Glucose, Bld 131 (*)    Calcium, Ion 1.06 (*)    All other components within normal limits  CBG MONITORING, ED - Abnormal; Notable for the following components:   Glucose-Capillary 120 (*)    All other components within normal limits  SARS CORONAVIRUS 2 BY RT PCR (HOSPITAL ORDER, PERFORMED IN [facility])  PROTIME-INR  APTT  DIFFERENTIAL  HIV ANTIBODY (ROUTINE TESTING W REFLEX)  CREATININE, SERUM    EKG EKG Interpretation  Date/Time:  Monday November 10 2019 20:37:45 EDT Ventricular Rate:  72 PR Interval:    QRS Duration: 87 QT Interval:  389 QTC Calculation: 426 R Axis:   79 Text Interpretation: Sinus rhythm Ventricular premature complex Nonspecific T abnormalities, inferior leads No old tracing to compare Confirmed by Meridee Score [phone]) on 11/10/2019 8:42:34 PM   Radiology MR BRAIN WO CONTRAST  Result Date: 11/10/2019 CLINICAL DATA:  Headache with numbness and tingling on the left side EXAM: MRI HEAD WITHOUT CONTRAST TECHNIQUE: Multiplanar, multiecho pulse sequences of the brain and surrounding structures were obtained without intravenous contrast. COMPARISON:  None. FINDINGS: BRAIN: There are multiple small acute infarcts of the cerebellum, involving both hemispheres, right greater than left. Small vessel infarct of the left thalamus is the only supratentorial infarct. Normal white matter signal. Normal volume of CSF spaces. No chronic microhemorrhage. Normal midline structures. VASCULAR: Major flow voids are preserved. SKULL AND UPPER CERVICAL SPINE: Normal calvarium and skull base. Visualized upper cervical spine and soft tissues are normal. SINUSES/ORBITS: No paranasal sinus fluid levels or advanced mucosal thickening. No mastoid or middle ear effusion. Normal orbits. 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 No hemorrhage or mass effect. Electronically Signed   By: Deatra Robinson M.D.   On: 11/10/2019 21:55   CT HEAD CODE STROKE WO CONTRAST  Result Date: 11/10/2019 CLINICAL DATA:  Code stroke.  Left-sided weakness. EXAM: CT HEAD WITHOUT CONTRAST TECHNIQUE: Contiguous axial images were obtained from the base of the skull through the vertex without intravenous contrast. COMPARISON:  Head CT 11/30/2008 FINDINGS: Brain: Cerebral volume is normal. There is no acute intracranial hemorrhage. No demarcated cortical infarct is identified. No extra-axial fluid collection. No evidence of intracranial mass. No midline shift. Vascular: No hyperdense vessel is identified. Skull: Normal. Negative for fracture or focal lesion. Sinuses/Orbits: Visualized orbits show no acute finding. Mild ethmoid and maxillary sinus mucosal thickening. Small left maxillary sinus mucous retention cyst. No significant mastoid effusion ASPECTS (Alberta Stroke Program Early CT Score) - Ganglionic level infarction (caudate, lentiform nuclei, internal capsule, insula, M1-M3 cortex): 7 - Supraganglionic infarction (M4-M6 cortex): 3 Total score (0-10 with 10 being normal): 10 These results were called by telephone at the time of interpretation on 11/10/2019 at 8:35 pm to provider Dr. Amada Jupiter, who verbally acknowledged these results. IMPRESSION: 1. No CT evidence of acute intracranial abnormality. ASPECTS is 10. 2. Mild ethmoid and maxillary sinus mucosal thickening. Small left maxillary sinus mucous retention cyst. Electronically Signed   By: Jackey Loge DO   On: 11/10/2019 20:36   CT ANGIO HEAD CODE STROKE  Result Date: 11/10/2019 CLINICAL DATA:  Stroke follow-up. Dizziness and left-sided numbness. EXAM: CT ANGIOGRAPHY HEAD AND NECK TECHNIQUE: Multidetector CT imaging of the head and neck was performed using the standard protocol during bolus administration of intravenous contrast. Multiplanar CT image reconstructions and MIPs were obtained to evaluate the vascular anatomy. Carotid stenosis measurements (when  applicable) are obtained utilizing NASCET criteria, using the distal internal carotid diameter as the denominator. CONTRAST:  24mL OMNIPAQUE IOHEXOL 350 MG/ML SOLN COMPARISON:  None. FINDINGS: CTA NECK FINDINGS SKELETON: There is no bony spinal canal stenosis. No lytic or blastic lesion. OTHER NECK: Normal pharynx, larynx and major salivary glands. No cervical lymphadenopathy. Unremarkable thyroid gland. UPPER CHEST: No pneumothorax or pleural effusion. No nodules or masses. AORTIC ARCH: There is no calcific atherosclerosis of the aortic arch. There is no aneurysm, dissection or hemodynamically significant stenosis of the visualized portion  of the aorta. Conventional 3 vessel aortic branching pattern. The visualized proximal subclavian arteries are widely patent. RIGHT CAROTID SYSTEM: Normal without aneurysm, dissection or stenosis. LEFT CAROTID SYSTEM: Normal without aneurysm, dissection or stenosis. VERTEBRAL ARTERIES: Left dominant configuration. Both origins are clearly patent. There is no dissection, occlusion or flow-limiting stenosis to the skull base (V1-V3 segments). CTA HEAD FINDINGS POSTERIOR CIRCULATION: --Vertebral arteries: Normal V4 segments. --Inferior cerebellar arteries: Normal. --Basilar artery: Normal. --Superior cerebellar arteries: Normal. --Posterior cerebral arteries (PCA): Normal. ANTERIOR CIRCULATION: --Intracranial internal carotid arteries: Normal. --Anterior cerebral arteries (ACA): Normal. Both A1 segments are present. Patent anterior communicating artery (a-comm). --Middle cerebral arteries (MCA): Normal. VENOUS SINUSES: As permitted by contrast timing, patent. ANATOMIC VARIANTS: Fetal origins of both posterior cerebral arteries. Review of the MIP images confirms the above findings. IMPRESSION: Normal CTA of the head and neck. Electronically Signed   By: Deatra Robinson M.D.   On: 11/10/2019 23:36   CT ANGIO NECK CODE STROKE  Result Date: 11/10/2019 CLINICAL DATA:  Stroke follow-up.  Dizziness and left-sided numbness. EXAM: CT ANGIOGRAPHY HEAD AND NECK TECHNIQUE: Multidetector CT imaging of the head and neck was performed using the standard protocol during bolus administration of intravenous contrast. Multiplanar CT image reconstructions and MIPs were obtained to evaluate the vascular anatomy. Carotid stenosis measurements (when applicable) are obtained utilizing NASCET criteria, using the distal internal carotid diameter as the denominator. CONTRAST:  75mL OMNIPAQUE IOHEXOL 350 MG/ML SOLN COMPARISON:  None. FINDINGS: CTA NECK FINDINGS SKELETON: There is no bony spinal canal stenosis. No lytic or blastic lesion. OTHER NECK: Normal pharynx, larynx and major salivary glands. No cervical lymphadenopathy. Unremarkable thyroid gland. UPPER CHEST: No pneumothorax or pleural effusion. No nodules or masses. AORTIC ARCH: There is no calcific atherosclerosis of the aortic arch. There is no aneurysm, dissection or hemodynamically significant stenosis of the visualized portion of the aorta. Conventional 3 vessel aortic branching pattern. The visualized proximal subclavian arteries are widely patent. RIGHT CAROTID SYSTEM: Normal without aneurysm, dissection or stenosis. LEFT CAROTID SYSTEM: Normal without aneurysm, dissection or stenosis. VERTEBRAL ARTERIES: Left dominant configuration. Both origins are clearly patent. There is no dissection, occlusion or flow-limiting stenosis to the skull base (V1-V3 segments). CTA HEAD FINDINGS POSTERIOR CIRCULATION: --Vertebral arteries: Normal V4 segments. --Inferior cerebellar arteries: Normal. --Basilar artery: Normal. --Superior cerebellar arteries: Normal. --Posterior cerebral arteries (PCA): Normal. ANTERIOR CIRCULATION: --Intracranial internal carotid arteries: Normal. --Anterior cerebral arteries (ACA): Normal. Both A1 segments are present. Patent anterior communicating artery (a-comm). --Middle cerebral arteries (MCA): Normal. VENOUS SINUSES: As permitted by  contrast timing, patent. ANATOMIC VARIANTS: Fetal origins of both posterior cerebral arteries. Review of the MIP images confirms the above findings. IMPRESSION: Normal CTA of the head and neck. Electronically Signed   By: Deatra Robinson M.D.   On: 11/10/2019 23:36    Procedures .Critical Care Performed by: Terrilee Files, MD Authorized by: Terrilee Files, MD   Critical care provider statement:    Critical care time (minutes):  45   Critical care time was exclusive of:  Separately billable procedures and treating other patients   Critical care was necessary to treat or prevent imminent or life-threatening deterioration of the following conditions:  CNS failure or compromise   Critical care was time spent personally by me on the following activities:  Discussions with consultants, evaluation of patient's response to treatment, examination of patient, ordering and performing treatments and interventions, ordering and review of laboratory studies, ordering and review of radiographic studies, pulse oximetry, re-evaluation of  patient's condition, obtaining history from patient or surrogate, review of old charts and development of treatment plan with patient or surrogate   I assumed direction of critical care for this patient from another provider in my specialty: no     (including critical care time)  Medications Ordered in ED Medications  sodium chloride flush (NS) 0.9 % injection 3 mL (3 mLs Intravenous Not Given 11/10/19 2103)  naphazoline-pheniramine (NAPHCON-A) 0.025-0.3 % ophthalmic solution 1 drop (has no administration in time range)   stroke: mapping our early stages of recovery book (has no administration in time range)  0.9 %  sodium chloride infusion ( Intravenous Rate/Dose Change 11/11/19 0711)  acetaminophen (TYLENOL) tablet 650 mg (has no administration in time range)    Or  acetaminophen (TYLENOL) 160 MG/5ML solution 650 mg (has no administration in time range)    Or    acetaminophen (TYLENOL) suppository 650 mg (has no administration in time range)  heparin injection 5,000 Units (5,000 Units Subcutaneous Given 11/11/19 0523)  pantoprazole (PROTONIX) EC tablet 40 mg (40 mg Oral Given 11/11/19 0957)  aspirin EC tablet 81 mg (81 mg Oral Given 11/11/19 0957)  clopidogrel (PLAVIX) tablet 75 mg (75 mg Oral Given 11/11/19 0957)  atorvastatin (LIPITOR) tablet 40 mg (40 mg Oral Given 11/11/19 0957)  metoCLOPramide (REGLAN) injection 10 mg (10 mg Intravenous Given 11/10/19 2054)  clopidogrel (PLAVIX) tablet 300 mg (300 mg Oral Given 11/10/19 2340)  iohexol (OMNIPAQUE) 350 MG/ML injection 75 mL (75 mLs Intravenous Contrast Given 11/10/19 2319)    ED Course  I have reviewed the triage vital signs and the nursing notes.  Pertinent labs & imaging results that were available during my care of the patient were reviewed by me and considered in my medical decision making (see chart for details).  Clinical Course as of Nov 10 1052  Mon Nov 10, 2019  2032 Patient was seen and evaluated by Dr. Amada Jupiter neurology.  He recommends giving the patient Reglan and ordering him an MRI brain.  Feels this is likely a complex migraine although cannot exclude stroke.   [MB]  2201 Reviewed MRI with Dr. Amada Jupiter.  He recommends admitting the patient for further work-up.  He still does not believe that the patient's severity warrants TPA.   [MB]  2254 Discussed with [facility] hospitalist Dr. Randol Kern who will evaluate the patient for admission.   [MB]    Clinical Course User Index [MB] Terrilee Files, MD   MDM Rules/Calculators/A&P                         This patient complains of acute onset of left-sided numbness; this involves an extensive number of treatment Options and is a complaint that carries with it a high risk of complications and Morbidity. The differential includes stroke, TIA, complicated migraine, metabolic derangement.  The patient was within the TPA window and so a  stroke activation was warranted.  I ordered, reviewed and interpreted labs, which included CBC with normal white count normal hemoglobin, chemistries normal, urine drug abuse positive for cocaine and THC I ordered medication aspirin I ordered imaging studies which included CT head and MRI brain and I independently    visualized and interpreted imaging which showed CT head showing no acute bleed, MRI brain showing multiple small acute infarcts Additional history obtained from patient significant other Previous records obtained and reviewed in epic, prior history of polysubstance abuse I consulted neurology Dr. Amada Jupiter and [facility] hospitalist Dr.  Elgergawy and discussed lab and imaging findings  Critical Interventions: Emergent evaluation for neurologic deficits within the TPA window and consideration for TPA  After the interventions stated above, I reevaluated the patient and found patient to remain having diminished sensation left side of body.  Will need admission for further stroke work-up.   Final Clinical Impression(s) / ED Diagnoses Final diagnoses:  Cerebrovascular accident (CVA), unspecified mechanism (HCC)    Rx / DC Orders ED Discharge Orders    None       Terrilee Files, MD 11/11/19 1101

## 2019-11-10 NOTE — ED Notes (Signed)
Pt to CTA via stretcher.

## 2019-11-10 NOTE — ED Notes (Signed)
Pt arrived back into room. 

## 2019-11-10 NOTE — H&P (Signed)
TRH H&P   Patient Demographics:    Fred Wood, is a 49 y.o. male  MRN: [id number]   DOB - 02-09-1971  Admit Date - 11/10/2019  Outpatient Primary MD for the patient is Patient, No Pcp Per  Referring MD/NP/PA: Dr Charm Barges  Patient coming from: Home  Chief Complaint  Patient presents with  . Code Stroke      HPI:    Fred Wood  is a 49 y.o. male, with past medical history of polysubstance abuse, tobacco abuse, patient presents to ED secondary to complaints of acute onset of left-sided tingling, numbness, in the head, arm and leg, it did start 30 minutes prior to arrival to ED, as well he does report just minimal weakness in the left side, he denies any previous similar symptoms, denies any slurred speech, altered mental status, loss of consciousness, fever, chills, patient reports history of cocaine abuse, most recently was Friday, where he smoked cocaine.  - IN ED if acute abnormalities, CT head with no acute findings, with his MRI of the brain significant for multiple small acute infarcts in the cerebellum, right greater than left, and the left thalamus, no hemorrhage or mass-effect, patient was seen by neurology, triage hospitalist were consulted to admit.    Review of systems:    In addition to the HPI above,  No Fever-chills, No Headache, No changes with Vision or hearing, No problems swallowing food or Liquids, No Chest pain, Cough or Shortness of Breath, No Abdominal pain, No Nausea or Vommitting, Bowel movements are regular, No Blood in stool or Urine, No dysuria, No new skin rashes or bruises, No new joints pains-aches,  Patient reports some tingling numbness in the left side, with some minimal weakness. No recent weight gain or loss, No polyuria, polydypsia or polyphagia, No significant Mental Stressors.  A full 10 point Review of Systems was done, except as stated  above, all other Review of Systems were negative.   With Past History of the following :    Past Medical History:  Diagnosis Date  . Cocaine abuse (HCC)   . Marijuana abuse   . Osteoarthritis of left shoulder   . Polysubstance abuse (HCC)   . PTSD (post-traumatic stress disorder)       Past Surgical History:  Procedure Laterality Date  . ACHILLES TENDON REPAIR    . HAND SURGERY        Social History:     Social History   Tobacco Use  . Smoking status: Current Every Day Smoker    Packs/day: 1.00    Years: 5.00    Pack years: 5.00    Types: Cigarettes  . Smokeless tobacco: Never Used  Substance Use Topics  . Alcohol use: Yes    Alcohol/week: 0.0 standard drinks        Family History :     Family History  Problem Relation Age of  Onset  . Diabetes Mother      Home Medications:   Prior to Admission medications   Medication Sig Start Date End Date Taking? Authorizing Provider  acetaminophen (TYLENOL) 500 MG tablet Take 1,000 mg by mouth every 6 (six) hours as needed for headache (pain).    [provider]  ibuprofen (ADVIL,MOTRIN) 200 MG tablet Take 600-800 mg by mouth every 6 (six) hours as needed for headache (pain).     [provider]  naphazoline-pheniramine (NAPHCON-A) 0.025-0.3 % ophthalmic solution Place 1 drop into both eyes 4 (four) times daily as needed for eye irritation or allergies.     [provider]     Allergies:    No Known Allergies   Physical Exam:   Vitals  Blood pressure 135/87, pulse 74, temperature 98.4 F (36.9 C), temperature source Oral, resp. rate 19, SpO2 99 %.   1. General developed male, laying in bed, in no apparent distress  2. Normal affect and insight, Not Suicidal or Homicidal, Awake Alert, Oriented X 3.  3. No F.N deficits, ALL C.Nerves Intact, Strength 5/5 in the right side, and left +4/5 and upper and lower extremities.  Sensation is diminished on the left  4. Ears and Eyes appear  Normal, Conjunctivae clear, PERRLA. Moist Oral Mucosa.  5. Supple Neck, No JVD, No cervical lymphadenopathy appriciated, No Carotid Bruits.  6. Symmetrical Chest wall movement, Good air movement bilaterally, CTAB.  7. RRR, No Gallops, Rubs or Murmurs, No Parasternal Heave.  8. Positive Bowel Sounds, Abdomen Soft, No tenderness, No organomegaly appriciated,No rebound -guarding or rigidity.  9.  No Cyanosis, Normal Skin Turgor, No Skin Rash or Bruise.  10. Good muscle tone,  joints appear normal , no effusions, Normal ROM.  11. No Palpable Lymph Nodes in Neck or Axillae    Data Review:    CBC Recent Labs  Lab 11/10/19 2020 11/10/19 2027  WBC 8.4  --   HGB 14.8 16.0  HCT 46.9 47.0  PLT 350  --   MCV 90.2  --   MCH 28.5  --   MCHC 31.6  --   RDW 15.9*  --   LYMPHSABS 2.4  --   MONOABS 0.7  --   EOSABS 0.2  --   BASOSABS 0.1  --    ------------------------------------------------------------------------------------------------------------------  Chemistries  Recent Labs  Lab 11/10/19 2020 11/10/19 2027  NA 139 140  K 3.8 3.7  CL 106 105  CO2 25  --   GLUCOSE 130* 131*  BUN 9 9  CREATININE 1.06 1.00  CALCIUM 8.4*  --   AST 20  --   ALT 15  --   ALKPHOS 47  --   BILITOT 0.8  --    ------------------------------------------------------------------------------------------------------------------ CrCl cannot be calculated (Unknown ideal weight.). ------------------------------------------------------------------------------------------------------------------ No results for input(s): TSH, T4TOTAL, T3FREE, THYROIDAB in the last 72 hours.  Invalid input(s): FREET3  Coagulation profile Recent Labs  Lab 11/10/19 2020  INR 1.0   ------------------------------------------------------------------------------------------------------------------- No results for input(s): DDIMER in the last 72  hours. -------------------------------------------------------------------------------------------------------------------  Cardiac Enzymes No results for input(s): CKMB, TROPONINI, MYOGLOBIN in the last 168 hours.  Invalid input(s): CK ------------------------------------------------------------------------------------------------------------------ No results found for: BNP   ---------------------------------------------------------------------------------------------------------------  Urinalysis No results found for: COLORURINE, APPEARANCEUR, LABSPEC, PHURINE, GLUCOSEU, HGBUR, BILIRUBINUR, KETONESUR, PROTEINUR, UROBILINOGEN, NITRITE, LEUKOCYTESUR  ----------------------------------------------------------------------------------------------------------------   Imaging Results:    MR BRAIN WO CONTRAST  Result Date: 11/10/2019 CLINICAL DATA:  Headache with numbness and tingling on the left side EXAM:  MRI HEAD WITHOUT CONTRAST TECHNIQUE: Multiplanar, multiecho pulse sequences of the brain and surrounding structures were obtained without intravenous contrast. COMPARISON:  None. FINDINGS: BRAIN: There are multiple small acute infarcts of the cerebellum, involving both hemispheres, right greater than left. Small vessel infarct of the left thalamus is the only supratentorial infarct. Normal white matter signal. Normal volume of CSF spaces. No chronic microhemorrhage. Normal midline structures. VASCULAR: Major flow voids are preserved. SKULL AND UPPER CERVICAL SPINE: Normal calvarium and skull base. Visualized upper cervical spine and soft tissues are normal. SINUSES/ORBITS: No paranasal sinus fluid levels or advanced mucosal thickening. No mastoid or middle ear effusion. Normal orbits. 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 No hemorrhage or mass effect. Electronically Signed   By: Deatra Robinson M.D.   On: 11/10/2019 21:55   CT  HEAD CODE STROKE WO CONTRAST  Result Date: 11/10/2019 CLINICAL DATA:  Code stroke.  Left-sided weakness. EXAM: CT HEAD WITHOUT CONTRAST TECHNIQUE: Contiguous axial images were obtained from the base of the skull through the vertex without intravenous contrast. COMPARISON:  Head CT 11/30/2008 FINDINGS: Brain: Cerebral volume is normal. There is no acute intracranial hemorrhage. No demarcated cortical infarct is identified. No extra-axial fluid collection. No evidence of intracranial mass. No midline shift. Vascular: No hyperdense vessel is identified. Skull: Normal. Negative for fracture or focal lesion. Sinuses/Orbits: Visualized orbits show no acute finding. Mild ethmoid and maxillary sinus mucosal thickening. Small left maxillary sinus mucous retention cyst. No significant mastoid effusion ASPECTS (Alberta Stroke Program Early CT Score) - Ganglionic level infarction (caudate, lentiform nuclei, internal capsule, insula, M1-M3 cortex): 7 - Supraganglionic infarction (M4-M6 cortex): 3 Total score (0-10 with 10 being normal): 10 These results were called by telephone at the time of interpretation on 11/10/2019 at 8:35 pm to provider Dr. Amada Jupiter, who verbally acknowledged these results. IMPRESSION: 1. No CT evidence of acute intracranial abnormality. ASPECTS is 10. 2. Mild ethmoid and maxillary sinus mucosal thickening. Small left maxillary sinus mucous retention cyst. Electronically Signed   By: Jackey Loge DO   On: 11/10/2019 20:36    My personal review of EKG: Rhythm NSR, Rate  72 /min, QTc 426   Assessment & Plan:    Active Problems:   Acute CVA (cerebrovascular accident) (HCC)   Polysubstance abuse (HCC)   Acute CVA -Neurology input greatly appreciated, have discussed with neurology, does appear to be of embolic nature, and posterior circulation. -Plan to proceed with dual antiplatelet therapy, will give aspirin 324 mg once, and will load with Plavix 300 mg once, continue with dual antiplatelet  therapy. -After discussion with neurology, will proceed with CTA head and neck to check mainly for posterior circulation. - check 2D echo. -Check lipid panel, hemoglobin A1c. -Consult PT/OT/SLP, patient already passed stroke bedside swallow evaluation in ED . -Using cocaine likely contributing to his stroke.  Polysubstance abuse -He was counseled, will check urine drug screen.  Abuse. -He was counseled   DVT Prophylaxis Heparin SCDs  AM Labs Ordered, also please review Full Orders  Family Communication: Admission, patients condition and plan of care including tests being ordered have been discussed with the patient and fiance at bedside who indicate understanding and agree with the plan and Code Status.  Code Status Full  Likely DC to  Home  Condition GUARDED   Consults called: Neurology  Admission status:Inpatient  Time spent in minutes : 55 minutes   Huey Bienenstock M.D on 11/10/2019 at 11:04  PM   [facility] - Office  [phone]

## 2019-11-10 NOTE — ED Notes (Signed)
Code stroke activated per RN Koula/ [facility] request

## 2019-11-11 ENCOUNTER — Inpatient Hospital Stay (HOSPITAL_COMMUNITY): Payer: No Typology Code available for payment source

## 2019-11-11 ENCOUNTER — Encounter (HOSPITAL_COMMUNITY): Payer: Non-veteran care

## 2019-11-11 DIAGNOSIS — I6389 Other cerebral infarction: Secondary | ICD-10-CM

## 2019-11-11 LAB — COMPREHENSIVE METABOLIC PANEL
ALT: 16 U/L (ref 0–44)
AST: 16 U/L (ref 15–41)
Albumin: 3 g/dL — ABNORMAL LOW (ref 3.5–5.0)
Alkaline Phosphatase: 39 U/L (ref 38–126)
Anion gap: 8 (ref 5–15)
BUN: 7 mg/dL (ref 6–20)
CO2: 26 mmol/L (ref 22–32)
Calcium: 8.1 mg/dL — ABNORMAL LOW (ref 8.9–10.3)
Chloride: 106 mmol/L (ref 98–111)
Creatinine, Ser: 1.06 mg/dL (ref 0.61–1.24)
GFR calc Af Amer: >60 mL/min (ref >60)
GFR calc non Af Amer: >60 mL/min (ref >60)
Glucose, Bld: 102 mg/dL — ABNORMAL HIGH (ref 70–99)
Potassium: 3.9 mmol/L (ref 3.5–5.1)
Sodium: 140 mmol/L (ref 135–145)
Total Bilirubin: 0.5 mg/dL (ref 0.3–1.2)
Total Protein: 5.8 g/dL — ABNORMAL LOW (ref 6.5–8.1)

## 2019-11-11 LAB — LIPID PANEL
Cholesterol: 165 mg/dL (ref 0–200)
HDL: 39 mg/dL — ABNORMAL LOW (ref >40)
LDL Cholesterol: 95 mg/dL (ref 0–99)
Total CHOL/HDL Ratio: 4.2 RATIO
Triglycerides: 156 mg/dL — ABNORMAL HIGH (ref <150)
VLDL: 31 mg/dL (ref 0–40)

## 2019-11-11 LAB — CREATININE, SERUM
Creatinine, Ser: 1.1 mg/dL (ref 0.61–1.24)
GFR calc Af Amer: >60 mL/min (ref >60)
GFR calc non Af Amer: >60 mL/min (ref >60)

## 2019-11-11 LAB — HEMOGLOBIN A1C
Hgb A1c MFr Bld: 6.1 % — ABNORMAL HIGH (ref 4.8–5.6)
Mean Plasma Glucose: 128.37 mg/dL

## 2019-11-11 LAB — ECHOCARDIOGRAM COMPLETE
Area-P 1/2: 2.87 cm2
S' Lateral: 3.7 cm

## 2019-11-11 LAB — RAPID URINE DRUG SCREEN, HOSP PERFORMED
Amphetamines: NOT DETECTED
Barbiturates: NOT DETECTED
Benzodiazepines: NOT DETECTED
Cocaine: POSITIVE — AB
Opiates: NOT DETECTED
Tetrahydrocannabinol: POSITIVE — AB

## 2019-11-11 LAB — CBC
HCT: 47.1 % (ref 39.0–52.0)
Hemoglobin: 14.8 g/dL (ref 13.0–17.0)
MCH: 28.2 pg (ref 26.0–34.0)
MCHC: 31.4 g/dL (ref 30.0–36.0)
MCV: 89.7 fL (ref 80.0–100.0)
Platelets: 311 10*3/uL (ref 150–400)
RBC: 5.25 MIL/uL (ref 4.22–5.81)
RDW: 15.9 % — ABNORMAL HIGH (ref 11.5–15.5)
WBC: 6 10*3/uL (ref 4.0–10.5)
nRBC: 0 % (ref 0.0–0.2)

## 2019-11-11 LAB — SARS CORONAVIRUS 2 BY RT PCR (HOSPITAL ORDER, PERFORMED IN ~~LOC~~ HOSPITAL LAB): SARS Coronavirus 2: NEGATIVE

## 2019-11-11 LAB — HIV ANTIBODY (ROUTINE TESTING W REFLEX): HIV Screen 4th Generation wRfx: NONREACTIVE

## 2019-11-11 MED ORDER — ATORVASTATIN CALCIUM 40 MG PO TABS
40.0000 mg | ORAL_TABLET | Freq: Every day | ORAL | Status: DC
  Administered 2019-11-11 – 2019-11-12 (×2): 40 mg via ORAL
  Filled 2019-11-11 (×2): qty 1

## 2019-11-11 NOTE — Progress Notes (Signed)
  Echocardiogram 2D Echocardiogram has been performed.  Fred Wood 11/11/2019, 10:41 AM

## 2019-11-11 NOTE — Progress Notes (Signed)
[facility] PROGRESS NOTE    Progress Note  Fred Wood  DEY:[id number] DOB: 08-Oct-1970 DOA: 11/10/2019 PCP: Patient, No Pcp Per     Brief Narrative:   Fred Wood is an 49 y.o. male past medical history of polysubstance abuse, tobacco comes into the ED for acute onset left-sided tingling numbness that started 30 days prior to arrival to the ED CT of the head showed no acute findings MRI of the brain showed multiple small acute infarcts in the cerebellum right greater than left and thalamus no hemorrhage or mass-effect.  Assessment/Plan:   Acute CVA (cerebrovascular accident) (HCC) HgbA1c 6.1, fasting lipid panel HDL 39, LDL 95 MRI, MRA of the brain without contrast multiple acute small infarct in the cerebellum. PT, OT, Speech consult pending Ct angio head and Neck unremarkable Transthoracic Echo,   pending Start patient on ASA 81mg  daily and plavix 75mg  daily  Atorvastatin 80  BP goal: permissive HTN upto 220/120 mmHg Telemetry monitoring HIV is negative.  Polysubstance abuse (HCC): UDS is positive for cocaine and monitor  DVT prophylaxis: lovenox Family Communication:none Status is: Inpatient  Remains inpatient appropriate because:Hemodynamically unstable   Dispo: The patient is from: Home              Anticipated d/c is to: Home              Anticipated d/c date is: 1 day              Patient currently is not medically stable to d/c.        Code Status:     Code Status Orders  (From admission, onward)         Start     Ordered   11/10/19 2259  Full code  Continuous        11/10/19 2258        Code Status History    Date Active Date Inactive Code Status Order ID Comments User Context   01/13/2017 1812 01/15/2017 1844 Full Code 01/15/2017  01/17/2017, PA-C ED   Advance Care Planning Activity        IV Access:    Peripheral IV   Procedures and diagnostic studies:   MR BRAIN WO CONTRAST  Result Date: 11/10/2019 CLINICAL  DATA:  Headache with numbness and tingling on the left side EXAM: MRI HEAD WITHOUT CONTRAST TECHNIQUE: Multiplanar, multiecho pulse sequences of the brain and surrounding structures were obtained without intravenous contrast. COMPARISON:  None. FINDINGS: BRAIN: There are multiple small acute infarcts of the cerebellum, involving both hemispheres, right greater than left. Small vessel infarct of the left thalamus is the only supratentorial infarct. Normal white matter signal. Normal volume of CSF spaces. No chronic microhemorrhage. Normal midline structures. VASCULAR: Major flow voids are preserved. SKULL AND UPPER CERVICAL SPINE: Normal calvarium and skull base. Visualized upper cervical spine and soft tissues are normal. SINUSES/ORBITS: No paranasal sinus fluid levels or advanced mucosal thickening. No mastoid or middle ear effusion. Normal orbits. 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 No hemorrhage or mass effect. Electronically Signed   By: Jaynie Crumble M.D.   On: 11/10/2019 21:55   CT HEAD CODE STROKE WO CONTRAST  Result Date: 11/10/2019 CLINICAL DATA:  Code stroke.  Left-sided weakness. EXAM: CT HEAD WITHOUT CONTRAST TECHNIQUE: Contiguous axial images were obtained from the base of the skull through the vertex without intravenous contrast. COMPARISON:  Head CT 11/30/2008 FINDINGS: Brain: Cerebral  volume is normal. There is no acute intracranial hemorrhage. No demarcated cortical infarct is identified. No extra-axial fluid collection. No evidence of intracranial mass. No midline shift. Vascular: No hyperdense vessel is identified. Skull: Normal. Negative for fracture or focal lesion. Sinuses/Orbits: Visualized orbits show no acute finding. Mild ethmoid and maxillary sinus mucosal thickening. Small left maxillary sinus mucous retention cyst. No significant mastoid effusion ASPECTS (Alberta Stroke Program Early CT Score) - Ganglionic level  infarction (caudate, lentiform nuclei, internal capsule, insula, M1-M3 cortex): 7 - Supraganglionic infarction (M4-M6 cortex): 3 Total score (0-10 with 10 being normal): 10 These results were called by telephone at the time of interpretation on 11/10/2019 at 8:35 pm to provider Dr. Amada Jupiter, who verbally acknowledged these results. IMPRESSION: 1. No CT evidence of acute intracranial abnormality. ASPECTS is 10. 2. Mild ethmoid and maxillary sinus mucosal thickening. Small left maxillary sinus mucous retention cyst. Electronically Signed   By: Jackey Loge DO   On: 11/10/2019 20:36   CT ANGIO HEAD CODE STROKE  Result Date: 11/10/2019 CLINICAL DATA:  Stroke follow-up. Dizziness and left-sided numbness. EXAM: CT ANGIOGRAPHY HEAD AND NECK TECHNIQUE: Multidetector CT imaging of the head and neck was performed using the standard protocol during bolus administration of intravenous contrast. Multiplanar CT image reconstructions and MIPs were obtained to evaluate the vascular anatomy. Carotid stenosis measurements (when applicable) are obtained utilizing NASCET criteria, using the distal internal carotid diameter as the denominator. CONTRAST:  75mL OMNIPAQUE IOHEXOL 350 MG/ML SOLN COMPARISON:  None. FINDINGS: CTA NECK FINDINGS SKELETON: There is no bony spinal canal stenosis. No lytic or blastic lesion. OTHER NECK: Normal pharynx, larynx and major salivary glands. No cervical lymphadenopathy. Unremarkable thyroid gland. UPPER CHEST: No pneumothorax or pleural effusion. No nodules or masses. AORTIC ARCH: There is no calcific atherosclerosis of the aortic arch. There is no aneurysm, dissection or hemodynamically significant stenosis of the visualized portion of the aorta. Conventional 3 vessel aortic branching pattern. The visualized proximal subclavian arteries are widely patent. RIGHT CAROTID SYSTEM: Normal without aneurysm, dissection or stenosis. LEFT CAROTID SYSTEM: Normal without aneurysm, dissection or stenosis.  VERTEBRAL ARTERIES: Left dominant configuration. Both origins are clearly patent. There is no dissection, occlusion or flow-limiting stenosis to the skull base (V1-V3 segments). CTA HEAD FINDINGS POSTERIOR CIRCULATION: --Vertebral arteries: Normal V4 segments. --Inferior cerebellar arteries: Normal. --Basilar artery: Normal. --Superior cerebellar arteries: Normal. --Posterior cerebral arteries (PCA): Normal. ANTERIOR CIRCULATION: --Intracranial internal carotid arteries: Normal. --Anterior cerebral arteries (ACA): Normal. Both A1 segments are present. Patent anterior communicating artery (a-comm). --Middle cerebral arteries (MCA): Normal. VENOUS SINUSES: As permitted by contrast timing, patent. ANATOMIC VARIANTS: Fetal origins of both posterior cerebral arteries. Review of the MIP images confirms the above findings. IMPRESSION: Normal CTA of the head and neck. Electronically Signed   By: Deatra Robinson M.D.   On: 11/10/2019 23:36   CT ANGIO NECK CODE STROKE  Result Date: 11/10/2019 CLINICAL DATA:  Stroke follow-up. Dizziness and left-sided numbness. EXAM: CT ANGIOGRAPHY HEAD AND NECK TECHNIQUE: Multidetector CT imaging of the head and neck was performed using the standard protocol during bolus administration of intravenous contrast. Multiplanar CT image reconstructions and MIPs were obtained to evaluate the vascular anatomy. Carotid stenosis measurements (when applicable) are obtained utilizing NASCET criteria, using the distal internal carotid diameter as the denominator. CONTRAST:  75mL OMNIPAQUE IOHEXOL 350 MG/ML SOLN COMPARISON:  None. FINDINGS: CTA NECK FINDINGS SKELETON: There is no bony spinal canal stenosis. No lytic or blastic lesion. OTHER NECK: Normal pharynx, larynx and major salivary  glands. No cervical lymphadenopathy. Unremarkable thyroid gland. UPPER CHEST: No pneumothorax or pleural effusion. No nodules or masses. AORTIC ARCH: There is no calcific atherosclerosis of the aortic arch. There is no  aneurysm, dissection or hemodynamically significant stenosis of the visualized portion of the aorta. Conventional 3 vessel aortic branching pattern. The visualized proximal subclavian arteries are widely patent. RIGHT CAROTID SYSTEM: Normal without aneurysm, dissection or stenosis. LEFT CAROTID SYSTEM: Normal without aneurysm, dissection or stenosis. VERTEBRAL ARTERIES: Left dominant configuration. Both origins are clearly patent. There is no dissection, occlusion or flow-limiting stenosis to the skull base (V1-V3 segments). CTA HEAD FINDINGS POSTERIOR CIRCULATION: --Vertebral arteries: Normal V4 segments. --Inferior cerebellar arteries: Normal. --Basilar artery: Normal. --Superior cerebellar arteries: Normal. --Posterior cerebral arteries (PCA): Normal. ANTERIOR CIRCULATION: --Intracranial internal carotid arteries: Normal. --Anterior cerebral arteries (ACA): Normal. Both A1 segments are present. Patent anterior communicating artery (a-comm). --Middle cerebral arteries (MCA): Normal. VENOUS SINUSES: As permitted by contrast timing, patent. ANATOMIC VARIANTS: Fetal origins of both posterior cerebral arteries. Review of the MIP images confirms the above findings. IMPRESSION: Normal CTA of the head and neck. Electronically Signed   By: Deatra Robinson M.D.   On: 11/10/2019 23:36     Medical Consultants:    None.  Anti-Infectives:   none  Subjective:    Fred Wood still having numbness and weakness on the left side  Objective:    Vitals:   11/11/19 0200 11/11/19 0300 11/11/19 0500 11/11/19 0600  BP: 110/70 112/74 119/68 112/64  Pulse: 68 68 74 67  Resp: 20 17 16 19   Temp:      TempSrc:      SpO2: 94% 96% 94% 97%   SpO2: 97 %  No intake or output data in the 24 hours ending 11/11/19 0703 There were no vitals filed for this visit.  Exam: General exam: In no acute distress. Respiratory system: Good air movement and clear to auscultation. Cardiovascular system: S1 & S2 heard, RRR. No  JVD. Gastrointestinal system: Abdomen is nondistended, soft and nontender.  Central nervous system: Awake alert and oriented x3 decreased sensation on the left upper and lower extremity very mild weakness in the left upper and lower extremity Extremities: No pedal edema. Skin: No rashes, lesions or ulcers Psychiatry: Judgement and insight appear normal. Mood & affect appropriate.    Data Reviewed:    Labs: Basic Metabolic Panel: Recent Labs  Lab 11/10/19 2020 11/10/19 2020 11/10/19 2027 11/10/19 2259 11/11/19 0555  NA 139  --  140  --  140  K 3.8   < > 3.7  --  3.9  CL 106  --  105  --  106  CO2 25  --   --   --  26  GLUCOSE 130*  --  131*  --  102*  BUN 9  --  9  --  7  CREATININE 1.06  --  1.00 1.10 1.06  CALCIUM 8.4*  --   --   --  8.1*   < > = values in this interval not displayed.   GFR CrCl cannot be calculated (Unknown ideal weight.). Liver Function Tests: Recent Labs  Lab 11/10/19 2020 11/11/19 0555  AST 20 16  ALT 15 16  ALKPHOS 47 39  BILITOT 0.8 0.5  PROT 6.7 5.8*  ALBUMIN 3.4* 3.0*   No results for input(s): LIPASE, AMYLASE in the last 168 hours. No results for input(s): AMMONIA in the last 168 hours. Coagulation profile Recent Labs  Lab 11/10/19 2020  INR 1.0   COVID-19 Labs  No results for input(s): DDIMER, FERRITIN, LDH, CRP in the last 72 hours.  Lab Results  Component Value Date   SARSCOV2NAA NEGATIVE 11/10/2019    CBC: Recent Labs  Lab 11/10/19 2020 11/10/19 2027 11/11/19 0555  WBC 8.4  --  6.0  NEUTROABS 5.0  --   --   HGB 14.8 16.0 14.8  HCT 46.9 47.0 47.1  MCV 90.2  --  89.7  PLT 350  --  311   Cardiac Enzymes: No results for input(s): CKTOTAL, CKMB, CKMBINDEX, TROPONINI in the last 168 hours. BNP (last 3 results) No results for input(s): PROBNP in the last 8760 hours. CBG: Recent Labs  Lab 11/10/19 2029  GLUCAP 120*   D-Dimer: No results for input(s): DDIMER in the last 72 hours. Hgb A1c: Recent Labs     11/11/19 0010  HGBA1C 6.1*   Lipid Profile: Recent Labs    11/11/19 0555  CHOL 165  HDL 39*  LDLCALC 95  TRIG 161156*  CHOLHDL 4.2   Thyroid function studies: No results for input(s): TSH, T4TOTAL, T3FREE, THYROIDAB in the last 72 hours.  Invalid input(s): FREET3 Anemia work up: No results for input(s): VITAMINB12, FOLATE, FERRITIN, TIBC, IRON, RETICCTPCT in the last 72 hours. Sepsis Labs: Recent Labs  Lab 11/10/19 2020 11/11/19 0555  WBC 8.4 6.0   Microbiology Recent Results (from the past 240 hour(s))  SARS Coronavirus 2 by RT PCR (hospital order, performed in [facility] lab) Nasopharyngeal Nasopharyngeal Swab     Status: None   Collection Time: 11/10/19 10:33 PM   Specimen: Nasopharyngeal Swab  Result Value Ref Range Status   SARS Coronavirus 2 NEGATIVE NEGATIVE Final    Comment: (NOTE) SARS-CoV-2 target nucleic acids are NOT DETECTED.  The SARS-CoV-2 RNA is generally detectable in upper and lower respiratory specimens during the acute phase of infection. The lowest concentration of SARS-CoV-2 viral copies this assay can detect is 250 copies / mL. A negative result does not preclude SARS-CoV-2 infection and should not be used as the sole basis for treatment or other patient management decisions.  A negative result may occur with improper specimen collection / handling, submission of specimen other than nasopharyngeal swab, presence of viral mutation(s) within the areas targeted by this assay, and inadequate number of viral copies (<250 copies / mL). A negative result must be combined with clinical observations, patient history, and epidemiological information.  Fact Sheet for Patients:   BoilerBrush.com.cyhttps://www.fda.gov/media/136312/download  Fact Sheet for Healthcare Providers: https://pope.com/https://www.fda.gov/media/136313/download  This test is not yet approved or  cleared by the [place] FDA and has been authorized for detection and/or diagnosis of SARS-CoV-2 by FDA  under an Emergency Use Authorization (EUA).  This EUA will remain in effect (meaning this test can be used) for the duration of the COVID-19 declaration under Section 564(b)(1) of the Act, 21 U.S.C. section 360bbb-3(b)(1), unless the authorization is terminated or revoked sooner.  Performed at [facility], [address] [place], [place] [address]      Medications:     stroke: mapping our early stages of recovery book   Does not apply Once   aspirin EC  81 mg Oral Daily   clopidogrel  75 mg Oral Daily   heparin  5,000 Units Subcutaneous Q8H   pantoprazole  40 mg Oral Daily   sodium chloride flush  3 mL Intravenous Once   Continuous Infusions:  sodium chloride 50 mL/hr at 11/10/19 2348  LOS: 1 day   Marinda Elk  [facility]  11/11/2019, 7:03 AM

## 2019-11-11 NOTE — Evaluation (Signed)
Physical Therapy Evaluation Patient Details Name: Fred Wood MRN: [id number] DOB: 04/16/1971 Today's Date: 11/11/2019   History of Present Illness  49 yo male admitted with L side tingling numbness MRI (+) multiple small acute infarcts in the cerebellum R greater than L and thalamus PMH polysubstance abuse/ tobacco, PTSD.  Clinical Impression   Pt presents with decreased LLE strength and coordination, impaired dynamic standing balance, impaired gait with near-scissoring, and decreased activity tolerance. Pt to benefit from acute PT to address deficits. Pt ambulated hallway distance with min guard to min assist for steadying, pt with near-scissoring of gait multiple times when balance challenged. Pt reporting lightheadedness during mobility, VSS. PT recommending OPPT and 24/7 assist from girlfriend post-acutely. PT to progress mobility as tolerated, and will continue to follow acutely.      Follow Up Recommendations Outpatient PT;Supervision/Assistance - 24 hour    Equipment Recommendations  None recommended by PT    Recommendations for Other Services       Precautions / Restrictions Precautions Precautions: Fall Restrictions Weight Bearing Restrictions: No      Mobility  Bed Mobility Overal bed mobility: Needs Assistance Bed Mobility: Supine to Sit;Sit to Supine     Supine to sit: Supervision Sit to supine: Min assist   General bed mobility comments: supervision for safety for coming to EOB, light assist for return to supine for LE lifting into bed. Increased time, use of bedrails to perform.  Transfers Overall transfer level: Needs assistance Equipment used: 1 person hand held assist Transfers: Sit to/from Stand Sit to Stand: Min assist;From elevated surface         General transfer comment: Min assist to steady, increased time to rise .  Ambulation/Gait Ambulation/Gait assistance: Min guard;Min assist Gait Distance (Feet): 175 Feet Assistive device: None;1  person hand held assist Gait Pattern/deviations: Step-through pattern;Decreased stride length;Narrow base of support;Scissoring Gait velocity: decr   General Gait Details: min guard for safety, requiring min assist when challenging balance (see balance section). Verbal cuing for watching for scissoring of gait, pt with multiple near-scissoring episodes  Stairs            Wheelchair Mobility    Modified Rankin (Stroke Patients Only)       Balance Overall balance assessment: Needs assistance Sitting-balance support: No upper extremity supported;Feet unsupported Sitting balance-Leahy Scale: Good     Standing balance support: No upper extremity supported;During functional activity Standing balance-Leahy Scale: Good Standing balance comment: tolerates challenge, but requires min assist to correct balance at times             High level balance activites: Backward walking;Direction changes;Head turns High Level Balance Comments: horizontal, vertical head turns: moderate unsteadiness requiring PT assist to steady; directional changes, pivot and stop, backwards walking: increased time             Pertinent Vitals/Pain Pain Assessment: No/denies pain    Home Living Family/patient expects to be discharged to:: Private residence Living Arrangements: Alone Available Help at Discharge: Family;Available 24 hours/day (pt's girlfriend Melody states she can be 24/7 as needed post-acutely, typically she works) Type of Home: Apartment Home Access: Level entry     Home Layout: One level Home Equipment: None      Prior Function Level of Independence: Independent         Comments: Pt does not work, lives with Melody (girlfriend)     Hand Dominance   Dominant Hand: Right    Extremity/Trunk Assessment   Upper Extremity Assessment Upper  Extremity Assessment: Defer to OT evaluation LUE Sensation: decreased light touch (tingling sensation)    Lower Extremity  Assessment Lower Extremity Assessment: LLE deficits/detail;RLE deficits/detail RLE Deficits / Details: 5/5 hip flexion, knee flex/ext, DF/PF RLE Sensation: decreased light touch (tingling sensation) LLE Deficits / Details: 3+/5 hip flexion, 4/5 knee extension, 3+/5 knee flexion, 4/5 DF LLE Sensation: decreased light touch    Cervical / Trunk Assessment Cervical / Trunk Assessment: Normal  Communication   Communication: No difficulties  Cognition Arousal/Alertness: Awake/alert Behavior During Therapy: WFL for tasks assessed/performed Overall Cognitive Status: Within Functional Limits for tasks assessed                                        General Comments General comments (skin integrity, edema, etc.): vss - pt reporting lightheadedness during mobility, BP pre- and post-ambulation 140/92 and 145/92 respectively.    Exercises Other Exercises Other Exercises: BE FAST acronym verbally reviewed and explained to pt   Assessment/Plan    PT Assessment Patient needs continued PT services  PT Problem List Decreased strength;Decreased mobility;Decreased safety awareness;Decreased activity tolerance;Decreased balance;Impaired sensation       PT Treatment Interventions Therapeutic activities;Gait training;Therapeutic exercise;Patient/family education;Balance training;Stair training;Functional mobility training;Neuromuscular re-education    PT Goals (Current goals can be found in the Care Plan section)  Acute Rehab PT Goals Patient Stated Goal: return to normal PT Goal Formulation: With patient Time For Goal Achievement: 11/25/19 Potential to Achieve Goals: Good    Frequency Min 4X/week   Barriers to discharge        Co-evaluation               AM-PAC PT "6 Clicks" Mobility  Outcome Measure Help needed turning from your back to your side while in a flat bed without using bedrails?: A Little Help needed moving from lying on your back to sitting on the side  of a flat bed without using bedrails?: A Little Help needed moving to and from a bed to a chair (including a wheelchair)?: A Little Help needed standing up from a chair using your arms (e.g., wheelchair or bedside chair)?: A Little Help needed to walk in hospital room?: A Little Help needed climbing 3-5 steps with a railing? : A Lot 6 Click Score: 17    End of Session   Activity Tolerance: Patient tolerated treatment well;Patient limited by fatigue Patient left: in bed;with call bell/phone within reach;with family/visitor present Nurse Communication: Mobility status PT Visit Diagnosis: Other abnormalities of gait and mobility (R26.89);Muscle weakness (generalized) (M62.81);Hemiplegia and hemiparesis Hemiplegia - Right/Left: Left Hemiplegia - dominant/non-dominant: Non-dominant Hemiplegia - caused by: Cerebral infarction    Time: 2505-3976 PT Time Calculation (min) (ACUTE ONLY): 25 min   Charges:   PT Evaluation $PT Eval Low Complexity: 1 Low PT Treatments $Gait Training: 8-22 mins        Klarisa Barman E, PT Acute Rehabilitation Services Pager [phone]  Office [phone]  Gerrica Cygan D Tkai Serfass 11/11/2019, 4:05 PM

## 2019-11-11 NOTE — ED Notes (Signed)
Dinner Tray Ordered @ 1709. 

## 2019-11-11 NOTE — ED Notes (Signed)
Lunch Tray Ordered @ 1045 

## 2019-11-11 NOTE — Progress Notes (Signed)
Txt paged [facility] that tele called and stated the pt had ST elevation in the V lead.

## 2019-11-11 NOTE — Progress Notes (Signed)
STROKE TEAM PROGRESS NOTE   INTERVAL HISTORY I have personally reviewed history of presenting illness, electronic medical records and imaging films in PACS. He presented with left face and upper extremity tingling numbness and MRI scan shows bilateral cerebellar and left thalamic infarcts.  Urine drug screen is positive for cocaine and marijuana.  CT angiogram shows no large vessel stenosis or occlusion. Vitals:   11/11/19 0200 11/11/19 0300 11/11/19 0500 11/11/19 0600  BP: 110/70 112/74 119/68 112/64  Pulse: 68 68 74 67  Resp: 20 17 16 19   Temp:      TempSrc:      SpO2: 94% 96% 94% 97%   CBC:  Recent Labs  Lab 11/10/19 2020 11/10/19 2020 11/10/19 2027 11/11/19 0555  WBC 8.4  --   --  6.0  NEUTROABS 5.0  --   --   --   HGB 14.8   < > 16.0 14.8  HCT 46.9   < > 47.0 47.1  MCV 90.2  --   --  89.7  PLT 350  --   --  311   < > = values in this interval not displayed.   Basic Metabolic Panel:  Recent Labs  Lab 11/10/19 2020 11/10/19 2020 11/10/19 2027 11/10/19 2027 11/10/19 2259 11/11/19 0555  NA 139   < > 140  --   --  140  K 3.8   < > 3.7  --   --  3.9  CL 106   < > 105  --   --  106  CO2 25  --   --   --   --  26  GLUCOSE 130*   < > 131*  --   --  102*  BUN 9   < > 9  --   --  7  CREATININE 1.06   < > 1.00   < > 1.10 1.06  CALCIUM 8.4*  --   --   --   --  8.1*   < > = values in this interval not displayed.   Lipid Panel:  Recent Labs  Lab 11/11/19 0555  CHOL 165  TRIG 156*  HDL 39*  CHOLHDL 4.2  VLDL 31  LDLCALC 95   HgbA1c:  Recent Labs  Lab 11/11/19 0010  HGBA1C 6.1*   Urine Drug Screen:  Recent Labs  Lab 11/11/19 0100  LABOPIA NONE DETECTED  COCAINSCRNUR POSITIVE*  LABBENZ NONE DETECTED  AMPHETMU NONE DETECTED  THCU POSITIVE*  LABBARB NONE DETECTED    Alcohol Level No results for input(s): ETH in the last 168 hours.  IMAGING past 24 hours MR BRAIN WO CONTRAST  Result Date: 11/10/2019 CLINICAL DATA:  Headache with numbness and tingling  on the left side EXAM: MRI HEAD WITHOUT CONTRAST TECHNIQUE: Multiplanar, multiecho pulse sequences of the brain and surrounding structures were obtained without intravenous contrast. COMPARISON:  None. FINDINGS: BRAIN: There are multiple small acute infarcts of the cerebellum, involving both hemispheres, right greater than left. Small vessel infarct of the left thalamus is the only supratentorial infarct. Normal white matter signal. Normal volume of CSF spaces. No chronic microhemorrhage. Normal midline structures. VASCULAR: Major flow voids are preserved. SKULL AND UPPER CERVICAL SPINE: Normal calvarium and skull base. Visualized upper cervical spine and soft tissues are normal. SINUSES/ORBITS: No paranasal sinus fluid levels or advanced mucosal thickening. No mastoid or middle ear effusion. Normal orbits. 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 No hemorrhage or  mass effect. Electronically Signed   By: Deatra Robinson M.D.   On: 11/10/2019 21:55   CT HEAD CODE STROKE WO CONTRAST  Result Date: 11/10/2019 CLINICAL DATA:  Code stroke.  Left-sided weakness. EXAM: CT HEAD WITHOUT CONTRAST TECHNIQUE: Contiguous axial images were obtained from the base of the skull through the vertex without intravenous contrast. COMPARISON:  Head CT 11/30/2008 FINDINGS: Brain: Cerebral volume is normal. There is no acute intracranial hemorrhage. No demarcated cortical infarct is identified. No extra-axial fluid collection. No evidence of intracranial mass. No midline shift. Vascular: No hyperdense vessel is identified. Skull: Normal. Negative for fracture or focal lesion. Sinuses/Orbits: Visualized orbits show no acute finding. Mild ethmoid and maxillary sinus mucosal thickening. Small left maxillary sinus mucous retention cyst. No significant mastoid effusion ASPECTS (Alberta Stroke Program Early CT Score) - Ganglionic level infarction (caudate, lentiform nuclei,  internal capsule, insula, M1-M3 cortex): 7 - Supraganglionic infarction (M4-M6 cortex): 3 Total score (0-10 with 10 being normal): 10 These results were called by telephone at the time of interpretation on 11/10/2019 at 8:35 pm to provider Dr. Amada Jupiter, who verbally acknowledged these results. IMPRESSION: 1. No CT evidence of acute intracranial abnormality. ASPECTS is 10. 2. Mild ethmoid and maxillary sinus mucosal thickening. Small left maxillary sinus mucous retention cyst. Electronically Signed   By: Jackey Loge DO   On: 11/10/2019 20:36   CT ANGIO HEAD CODE STROKE  Result Date: 11/10/2019 CLINICAL DATA:  Stroke follow-up. Dizziness and left-sided numbness. EXAM: CT ANGIOGRAPHY HEAD AND NECK TECHNIQUE: Multidetector CT imaging of the head and neck was performed using the standard protocol during bolus administration of intravenous contrast. Multiplanar CT image reconstructions and MIPs were obtained to evaluate the vascular anatomy. Carotid stenosis measurements (when applicable) are obtained utilizing NASCET criteria, using the distal internal carotid diameter as the denominator. CONTRAST:  66mL OMNIPAQUE IOHEXOL 350 MG/ML SOLN COMPARISON:  None. FINDINGS: CTA NECK FINDINGS SKELETON: There is no bony spinal canal stenosis. No lytic or blastic lesion. OTHER NECK: Normal pharynx, larynx and major salivary glands. No cervical lymphadenopathy. Unremarkable thyroid gland. UPPER CHEST: No pneumothorax or pleural effusion. No nodules or masses. AORTIC ARCH: There is no calcific atherosclerosis of the aortic arch. There is no aneurysm, dissection or hemodynamically significant stenosis of the visualized portion of the aorta. Conventional 3 vessel aortic branching pattern. The visualized proximal subclavian arteries are widely patent. RIGHT CAROTID SYSTEM: Normal without aneurysm, dissection or stenosis. LEFT CAROTID SYSTEM: Normal without aneurysm, dissection or stenosis. VERTEBRAL ARTERIES: Left dominant  configuration. Both origins are clearly patent. There is no dissection, occlusion or flow-limiting stenosis to the skull base (V1-V3 segments). CTA HEAD FINDINGS POSTERIOR CIRCULATION: --Vertebral arteries: Normal V4 segments. --Inferior cerebellar arteries: Normal. --Basilar artery: Normal. --Superior cerebellar arteries: Normal. --Posterior cerebral arteries (PCA): Normal. ANTERIOR CIRCULATION: --Intracranial internal carotid arteries: Normal. --Anterior cerebral arteries (ACA): Normal. Both A1 segments are present. Patent anterior communicating artery (a-comm). --Middle cerebral arteries (MCA): Normal. VENOUS SINUSES: As permitted by contrast timing, patent. ANATOMIC VARIANTS: Fetal origins of both posterior cerebral arteries. Review of the MIP images confirms the above findings. IMPRESSION: Normal CTA of the head and neck. Electronically Signed   By: Deatra Robinson M.D.   On: 11/10/2019 23:36   CT ANGIO NECK CODE STROKE  Result Date: 11/10/2019 CLINICAL DATA:  Stroke follow-up. Dizziness and left-sided numbness. EXAM: CT ANGIOGRAPHY HEAD AND NECK TECHNIQUE: Multidetector CT imaging of the head and neck was performed using the standard protocol during bolus administration of intravenous contrast. Multiplanar  CT image reconstructions and MIPs were obtained to evaluate the vascular anatomy. Carotid stenosis measurements (when applicable) are obtained utilizing NASCET criteria, using the distal internal carotid diameter as the denominator. CONTRAST:  62mL OMNIPAQUE IOHEXOL 350 MG/ML SOLN COMPARISON:  None. FINDINGS: CTA NECK FINDINGS SKELETON: There is no bony spinal canal stenosis. No lytic or blastic lesion. OTHER NECK: Normal pharynx, larynx and major salivary glands. No cervical lymphadenopathy. Unremarkable thyroid gland. UPPER CHEST: No pneumothorax or pleural effusion. No nodules or masses. AORTIC ARCH: There is no calcific atherosclerosis of the aortic arch. There is no aneurysm, dissection or  hemodynamically significant stenosis of the visualized portion of the aorta. Conventional 3 vessel aortic branching pattern. The visualized proximal subclavian arteries are widely patent. RIGHT CAROTID SYSTEM: Normal without aneurysm, dissection or stenosis. LEFT CAROTID SYSTEM: Normal without aneurysm, dissection or stenosis. VERTEBRAL ARTERIES: Left dominant configuration. Both origins are clearly patent. There is no dissection, occlusion or flow-limiting stenosis to the skull base (V1-V3 segments). CTA HEAD FINDINGS POSTERIOR CIRCULATION: --Vertebral arteries: Normal V4 segments. --Inferior cerebellar arteries: Normal. --Basilar artery: Normal. --Superior cerebellar arteries: Normal. --Posterior cerebral arteries (PCA): Normal. ANTERIOR CIRCULATION: --Intracranial internal carotid arteries: Normal. --Anterior cerebral arteries (ACA): Normal. Both A1 segments are present. Patent anterior communicating artery (a-comm). --Middle cerebral arteries (MCA): Normal. VENOUS SINUSES: As permitted by contrast timing, patent. ANATOMIC VARIANTS: Fetal origins of both posterior cerebral arteries. Review of the MIP images confirms the above findings. IMPRESSION: Normal CTA of the head and neck. Electronically Signed   By: Deatra Robinson M.D.   On: 11/10/2019 23:36    PHYSICAL EXAM Pleasant middle-aged African-American male not in distress. . Afebrile. Head is nontraumatic. Neck is supple without bruit.    Cardiac exam no murmur or gallop. Lungs are clear to auscultation. Distal pulses are well felt. Neurological Exam :  He is awake alert oriented to time place and person.  Speech is clear without aphasia or dysarthria.  Extraocular movements are full range without nystagmus.  No facial weakness but subjective diminished left hemiface and body sensation.  Motor system exam shows symmetric upper and lower extremity strength without any drift.  No focal weakness.  Coordination is intact.  Reflexes are symmetric.  Gait not  tested.   ASSESSMENT/PLAN Fred Wood is a 49 y.o. male with history of cocaine abuse, PTSD presenting with L sided numbness.   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  Sinus dz. ASPECTS 10.     MRI  R>L small cerebellar + L thalamic infarcts  CTA head & neck Unremarkable   2D Echo EF 60-65%. No source of embolus   LDL 95  HgbA1c 6.1  VTE prophylaxis - Heparin 5000 units sq tid   No antithrombotic prior to admission, now on aspirin 81 mg daily and clopidogrel 75 mg daily following plavix load. Continue DAPT x 3 weeks then aspirin alone    Therapy recommendations:  pending   Disposition:  pending   Hyperlipidemia  Home meds:  No statin  Add lipitor 40  LDL 95, goal < 70  Continue statin at discharge  PreDiabetes  HgbA1c 6.1, goal < 7.0  Other Stroke Risk Factors  Cigarette smoker, advised to stop smoking  ETOH use, advised to drink no more than 2 drink(s) a day  PolySubstance abuse, cocaine & THC - UDS:  THC POSITIVE, Cocaine POSITIVE. Patient advised to stop using due to stroke  risk.  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  Patient counseled to quit cocaine, marijuana and smoking.  Recommend aspirin and Plavix for 3 weeks followed by aspirin alone and aggressive risk factor modification.  Patient is not a good long-term candidate for anticoagulation given his drug abuse history and will not pursue further evaluation with TEE and loop recorder at the present time.  Greater than 50% time during this 35-minute visit was spent on counseling and coordination of care about his strokes and answering questions.  Discussed with Dr. Lambert KetoAbraham Feliz Gwenith Dailyrtiz Danh Bayus, MD To contact Stroke Continuity provider,  please refer to [email or web address]. After hours, contact General Neurology

## 2019-11-12 DIAGNOSIS — I639 Cerebral infarction, unspecified: Secondary | ICD-10-CM

## 2019-11-12 MED ORDER — ATORVASTATIN CALCIUM 40 MG PO TABS: 40.0000 mg | ORAL_TABLET | Freq: Every day | ORAL | 0 refills | Status: AC

## 2019-11-12 MED ORDER — CLOPIDOGREL BISULFATE 75 MG PO TABS: 75.0000 mg | ORAL_TABLET | Freq: Every day | ORAL | 0 refills | Status: DC

## 2019-11-12 MED ORDER — CLOPIDOGREL BISULFATE 75 MG PO TABS: 75.0000 mg | ORAL_TABLET | Freq: Every day | ORAL | 0 refills | Status: AC

## 2019-11-12 MED ORDER — ATORVASTATIN CALCIUM 40 MG PO TABS: 40.0000 mg | ORAL_TABLET | Freq: Every day | ORAL | 0 refills | Status: DC

## 2019-11-12 MED ORDER — ASPIRIN 81 MG PO TBEC: 81.0000 mg | DELAYED_RELEASE_TABLET | Freq: Every day | ORAL | 0 refills | Status: AC

## 2019-11-12 MED ORDER — ASPIRIN 81 MG PO TBEC: 81.0000 mg | DELAYED_RELEASE_TABLET | Freq: Every day | ORAL | 0 refills | Status: DC

## 2019-11-12 NOTE — Care Management (Addendum)
1049 11-12-19 Patient is without insurance- he only has his VA Benefit. Case Manager is unsure if the patient is service connected- reached out to the [facility] to get additional information on the patient and to see if they have an outpatient program for the patient to attend. Patient's ride is en route. Case Manager awaiting call from the [facility]. Gala Lewandowsky , RN, BSN Case Manager    1229 11-12-19 Case Manager reached out to [employer] She advised Case Manager that they do have outpatient physical therapy- prescription with outpatient PT/OT written, and additional information to be faxed to the [facility]. Patient provided verbal permission to fax information to the [place]. Patient is without additional insurance and this will be the only way he would be able to receive the therapy; unable to afford if goes anywhere else. Graves-Bigelow, Lamar Laundry, RN, BSN

## 2019-11-12 NOTE — Plan of Care (Signed)
Pt alert and oriented. Pt on RA. Pt is a 1 assist with walking to the bathroom. Pt gets dizzy when walking. Ortho BP taken.  Problem: Education: Goal: Knowledge of disease or condition will improve Outcome: Progressing Goal: Knowledge of secondary prevention will improve Outcome: Progressing Goal: Knowledge of patient specific risk factors addressed and post discharge goals established will improve Outcome: Progressing Goal: Individualized Educational Video(s) Outcome: Progressing   Problem: Coping: Goal: Will verbalize positive feelings about self Outcome: Progressing Goal: Will identify appropriate support needs Outcome: Progressing   Problem: Health Behavior/Discharge Planning: Goal: Ability to manage health-related needs will improve Outcome: Progressing   Problem: Self-Care: Goal: Ability to participate in self-care as condition permits will improve Outcome: Progressing   Problem: Ischemic Stroke/TIA Tissue Perfusion: Goal: Complications of ischemic stroke/TIA will be minimized Outcome: Progressing

## 2019-11-12 NOTE — Evaluation (Signed)
Speech Language Pathology Evaluation Patient Details Name: Fred Wood MRN: [id number] DOB: 03-11-1971 Today's Date: 11/12/2019 Time: 1324-4010 SLP Time Calculation (min) (ACUTE ONLY): 21 min  Problem List:  Patient Active Problem List   Diagnosis Date Noted  . Cerebrovascular accident (CVA) (HCC) 11/10/2019  . Polysubstance abuse (HCC) 11/10/2019  . Marijuana intoxication, with perceptual disturbance (HCC) 01/15/2017   Past Medical History:  Past Medical History:  Diagnosis Date  . Cocaine abuse (HCC)   . Marijuana abuse   . Osteoarthritis of left shoulder   . Polysubstance abuse (HCC)   . PTSD (post-traumatic stress disorder)    Past Surgical History:  Past Surgical History:  Procedure Laterality Date  . ACHILLES TENDON REPAIR    . HAND SURGERY     HPI:  49 yo male admitted with L side tingling numbness MRI (+) multiple small acute infarcts in the cerebellum R greater than L and thalamus PMH polysubstance abuse/ tobacco, PTSD.   Assessment / Plan / Recommendation Clinical Impression  Pt scored a 29/30 on the SLUMS, losing a point only for missing one word on the delayed recall task. When given a contextual cue, he promptly recalled that word. In addition to this testing, he also demonstrates good safety and anticipatory awareness, asking questions about planning for short-term and longer-term needs. He describes mild numbness in his face, but denies any impact on swallowing/mastication or speech. Do not anticipate that pt will need any ongoing SLP follow up. He is a little concerned about his ability to return to school, so SLP did provide pt with resources to pursue PRN. Education was also provided about precautions upon return home and activities to try to utilize his higher level thinking skills prior to return to school.     SLP Assessment  SLP Recommendation/Assessment: Patient does not need any further Speech Lanaguage Pathology Services SLP Visit Diagnosis: Cognitive  communication deficit (R41.841)    Follow Up Recommendations  None    Frequency and Duration           SLP Evaluation Cognition  Overall Cognitive Status: Within Functional Limits for tasks assessed Orientation Level: Oriented X4       Comprehension  Auditory Comprehension Overall Auditory Comprehension: Appears within functional limits for tasks assessed    Expression Expression Primary Mode of Expression: Verbal Verbal Expression Overall Verbal Expression: Appears within functional limits for tasks assessed   Oral / Motor  Oral Motor/Sensory Function Overall Oral Motor/Sensory Function: Mild impairment Facial ROM: Within Functional Limits Facial Symmetry: Within Functional Limits Facial Strength: Within Functional Limits Facial Sensation: Reduced left Motor Speech Overall Motor Speech: Appears within functional limits for tasks assessed   GO                    Mahala Menghini., M.A. CCC-SLP Acute Rehabilitation Services Pager [phone] Office [phone]  11/12/2019, 11:36 AM

## 2019-11-12 NOTE — Progress Notes (Signed)
OT EValuation  11/11/19 1100  OT Visit Information  Last OT Received On 11/11/19  Assistance Needed +1  History of Present Illness 49 yo male admitted with L side tingling numbness MRI (+) multiple small acute infarcts in the cerebellum R greater than L and thalamus PMH polysubstance abuse/ tobacco,   Precautions  Precautions Fall  Home Living  Family/patient expects to be discharged to: Private residence  Living Arrangements Alone  Type of Home Apartment  Home Access Level entry  Home Layout One level  Bathroom Shower/Tub Tub/shower unit  Tour manager None  Additional Comments calls Melody (girlfriend)  Prior Function  Level of Independence Independent  Communication  Communication No difficulties  Pain Assessment  Pain Assessment No/denies pain  Cognition  Arousal/Alertness Awake/alert  Behavior During Therapy WFL for tasks assessed/performed  Overall Cognitive Status Within Functional Limits for tasks assessed  Upper Extremity Assessment  Upper Extremity Assessment LUE deficits/detail  LUE Deficits / Details ataxic movement. decrease sensation,   LUE Sensation decreased light touch  LUE Coordination decreased fine motor;decreased gross motor  Lower Extremity Assessment  Lower Extremity Assessment Defer to PT evaluation;LLE deficits/detail  LLE Deficits / Details ataxic movement with testing  LLE Sensation decreased light touch  Cervical / Trunk Assessment  Cervical / Trunk Assessment Normal  ADL  Overall ADL's  Needs assistance/impaired  Eating/Feeding Set up  Grooming Set up;Sitting  Upper Body Bathing Minimal assistance;Sitting  Lower Body Bathing Minimal assistance;Sit to/from Paramedic Details (indicate cue type and reason) hand held (A) simulated   Functional mobility during ADLs Minimal assistance  General ADL Comments pt reports HA immediately with movement. BP stable. Pt attempting  gaze stabilization with movement and again reports HA with movement.   Vision- History  Baseline Vision/History Wears glasses  Wears Glasses At all times  Patient Visual Report  (doesnt wear at home but does out in community)  Vision- Assessment  Additional Comments pt tracking in all visual fields  Bed Mobility  Overal bed mobility Modified Independent  Transfers  Overall transfer level Needs assistance  Equipment used 1 person hand held assist  Transfers Sit to/from Stand  Sit to Stand Min assist  Balance  Overall balance assessment Mild deficits observed, not formally tested  General Comments  General comments (skin integrity, edema, etc.) vitals stable during session. pt reports HA with movement but no changes in vitals noted  Exercises  Exercises Other exercises  Other Exercises  Other Exercises had patient grab a cup multiple times with thumb upward to practice correct sequencing  OT - End of Session  Activity Tolerance Patient tolerated treatment well  Patient left in bed;with call bell/phone within reach;with family/visitor present  Nurse Communication Mobility status;Precautions  OT Assessment  OT Recommendation/Assessment Patient needs continued OT Services  OT Visit Diagnosis Unsteadiness on feet (R26.81);Muscle weakness (generalized) (M62.81)  OT Problem List Decreased strength;Decreased activity tolerance;Impaired balance (sitting and/or standing);Decreased coordination;Decreased knowledge of precautions;Decreased knowledge of use of DME or AE;Impaired UE functional use  OT Plan  OT Frequency (ACUTE ONLY) Min 2X/week  OT Treatment/Interventions (ACUTE ONLY) Self-care/ADL training;Therapeutic exercise;Neuromuscular education;Energy conservation;DME and/or AE instruction;Therapeutic activities;Cognitive remediation/compensation;Patient/family education;Balance training  AM-PAC OT "6 Clicks" Daily Activity Outcome Measure (Version 2)  Help from another person eating  meals? 4  Help from another person taking care of personal grooming? 4  Help from another person toileting, which includes using toliet, bedpan, or urinal? 3  Help from another  person bathing (including washing, rinsing, drying)? 3  Help from another person to put on and taking off regular upper body clothing? 4  Help from another person to put on and taking off regular lower body clothing? 3  6 Click Score 21  OT Recommendation  Follow Up Recommendations Outpatient OT  OT Equipment None recommended by OT  Individuals Consulted  Consulted and Agree with Results and Recommendations Patient;Family member/caregiver  Family Member Consulted melody girlfriend  Acute Rehab OT Goals  Patient Stated Goal to gain use of L hand again  OT Goal Formulation With patient  Time For Goal Achievement 11/25/19  Potential to Achieve Goals Good  OT Time Calculation  OT Start Time (ACUTE ONLY) 1149  OT Stop Time (ACUTE ONLY) 1210  OT Time Calculation (min) 21 min  OT General Charges  $OT Visit 1 Visit  OT Evaluation  $OT Eval Moderate Complexity 1 Mod  Written Expression  Dominant Hand Right    Brynn, OTR/L  Acute Rehabilitation Services Pager: [phone] Office: [phone] .

## 2019-11-12 NOTE — Progress Notes (Signed)
Physical Therapy Treatment Patient Details Name: Fred Wood MRN: [id number] DOB: 02-Aug-1970 Today's Date: 11/12/2019    History of Present Illness 49 yo male admitted with L side tingling numbness MRI (+) multiple small acute infarcts in the cerebellum R greater than L and thalamus PMH polysubstance abuse/ tobacco, PTSD.    PT Comments    PT session focused on providing pt with balance, habituation, and strengthening exercises for HEP as he reports he may be unable to do outpt PT due to financial reasons.  Educated on safe exercises as well as ways to progress.     Follow Up Recommendations  Outpatient PT     Equipment Recommendations  None recommended by PT    Recommendations for Other Services       Precautions / Restrictions Precautions Precautions: Fall    Mobility  Bed Mobility Overal bed mobility: Independent             General bed mobility comments: Pt up and moving in room at arrival  Transfers Overall transfer level: Independent                  Ambulation/Gait Ambulation/Gait assistance: Independent Gait Distance (Feet): 100 Feet Assistive device: None Gait Pattern/deviations: Decreased stance time - left     General Gait Details: Focused on challenging balance ( see balance); no scissoring today   Stairs             Wheelchair Mobility    Modified Rankin (Stroke Patients Only) Modified Rankin (Stroke Patients Only) Pre-Morbid Rankin Score: No symptoms Modified Rankin: Slight disability     Balance     Sitting balance-Leahy Scale: Normal     Standing balance support: No upper extremity supported Standing balance-Leahy Scale: Good Standing balance comment: tolerates challenge, but requires min assist to correct balance at times               High Level Balance Comments:   With standing: SLS on L with UE support x 10, target tapping with bil feet x 5 with UE support at times, single leg heel raises x 10, standing  feet together EO and EC - steady, tandem - required UE support; picking item from floor - steady  With Walking: Horizontal and vertical hean turns - steady; foward/backwars/sidestepping - steady; directional changes and pivots - steady but increased time            Cognition Arousal/Alertness: Awake/alert Behavior During Therapy: WFL for tasks assessed/performed Overall Cognitive Status: Within Functional Limits for tasks assessed                                        Exercises      General Comments General comments (skin integrity, edema, etc.):  Habituation exercises : head turns, smooth pursuit, and gaze stabilization in sitting.    Pt reports unable to afford outpt PT (discussed considering but with low frequency visits) but did give pt HEP for balance (discussed progression EO,EC, unlevel surface, and performing safely in corner or UE support), LE strengthening, and habituation. Discussed focus point if dizzy with movement.      Pertinent Vitals/Pain Pain Assessment: No/denies pain Faces Pain Scale: No hurt    Home Living     Available Help at Discharge: Friend(s);Available 24 hours/day Type of Home: Apartment              Prior Function  PT Goals (current goals can now be found in the care plan section) Acute Rehab PT Goals Patient Stated Goal: return to normal PT Goal Formulation: With patient Time For Goal Achievement: 11/25/19 Potential to Achieve Goals: Good Progress towards PT goals: Progressing toward goals    Frequency    Min 4X/week      PT Plan Current plan remains appropriate    Co-evaluation              AM-PAC PT "6 Clicks" Mobility   Outcome Measure  Help needed turning from your back to your side while in a flat bed without using bedrails?: None Help needed moving from lying on your back to sitting on the side of a flat bed without using bedrails?: None Help needed moving to and from a bed to a  chair (including a wheelchair)?: None Help needed standing up from a chair using your arms (e.g., wheelchair or bedside chair)?: None Help needed to walk in hospital room?: None Help needed climbing 3-5 steps with a railing? : None 6 Click Score: 24    End of Session   Activity Tolerance: Patient tolerated treatment well Patient left: in bed;with call bell/phone within reach Nurse Communication: Mobility status PT Visit Diagnosis: Other abnormalities of gait and mobility (R26.89);Muscle weakness (generalized) (M62.81);Hemiplegia and hemiparesis Hemiplegia - Right/Left: Left Hemiplegia - dominant/non-dominant: Non-dominant Hemiplegia - caused by: Cerebral infarction     Time: 5809-9833 PT Time Calculation (min) (ACUTE ONLY): 30 min  Charges:  $Neuromuscular Re-education: 23-37 mins                     Anise Salvo, PT Acute Rehab Services Pager [phone] [facility] [phone]     Rayetta Humphrey 11/12/2019, 12:33 PM

## 2019-11-12 NOTE — Progress Notes (Signed)
12 lead EKG done and placed in chart.

## 2019-11-12 NOTE — Progress Notes (Signed)
Patient discharged home. Family here for transportation home. All discharge paperwork went over with patient. All questions and concerns addressed. All belongings sent with patient. PT/OT gave instructions/ handouts for exercises at home. Patient taken down in wheelchair. Tyron Russell Takeila Thayne

## 2019-11-12 NOTE — Progress Notes (Signed)
STROKE TEAM PROGRESS NOTE   INTERVAL HISTORY Patient is doing well.  He feels he is back to baseline.  He wants to go home.  He has no complaints  Vitals:   11/11/19 2216 11/11/19 2329 11/12/19 0358 11/12/19 0827  BP: (!) 144/100 128/78 118/73 (!) 126/92  Pulse: 77 67 70 73  Resp:  19 19 18   Temp:  98.4 F (36.9 C) 98.4 F (36.9 C) 98.3 F (36.8 C)  TempSrc:  Oral Oral Oral  SpO2:  94% 95% 100%   CBC:  Recent Labs  Lab 11/10/19 2020 11/10/19 2020 11/10/19 2027 11/11/19 0555  WBC 8.4  --   --  6.0  NEUTROABS 5.0  --   --   --   HGB 14.8   < > 16.0 14.8  HCT 46.9   < > 47.0 47.1  MCV 90.2  --   --  89.7  PLT 350  --   --  311   < > = values in this interval not displayed.   Basic Metabolic Panel:  Recent Labs  Lab 11/10/19 2020 11/10/19 2020 11/10/19 2027 11/10/19 2027 11/10/19 2259 11/11/19 0555  NA 139   < > 140  --   --  140  K 3.8   < > 3.7  --   --  3.9  CL 106   < > 105  --   --  106  CO2 25  --   --   --   --  26  GLUCOSE 130*   < > 131*  --   --  102*  BUN 9   < > 9  --   --  7  CREATININE 1.06   < > 1.00   < > 1.10 1.06  CALCIUM 8.4*  --   --   --   --  8.1*   < > = values in this interval not displayed.   Lipid Panel:  Recent Labs  Lab 11/11/19 0555  CHOL 165  TRIG 156*  HDL 39*  CHOLHDL 4.2  VLDL 31  LDLCALC 95   HgbA1c:  Recent Labs  Lab 11/11/19 0010  HGBA1C 6.1*   Urine Drug Screen:  Recent Labs  Lab 11/11/19 0100  LABOPIA NONE DETECTED  COCAINSCRNUR POSITIVE*  LABBENZ NONE DETECTED  AMPHETMU NONE DETECTED  THCU POSITIVE*  LABBARB NONE DETECTED    Alcohol Level No results for input(s): ETH in the last 168 hours.  IMAGING past 24 hours No results found.  PHYSICAL EXAM    Pleasant middle-aged African-American male not in distress. . Afebrile. Head is nontraumatic. Neck is supple without bruit.    Cardiac exam no murmur or gallop. Lungs are clear to auscultation. Distal pulses are well felt. Neurological Exam :  He is  awake alert oriented to time place and person.  Speech is clear without aphasia or dysarthria.  Extraocular movements are full range without nystagmus.  No facial weakness but subjective diminished left hemiface and body sensation.  Motor system exam shows symmetric upper and lower extremity strength without any drift.  No focal weakness.  Coordination is intact.  Reflexes are symmetric.  Gait not tested.     ASSESSMENT/PLAN Fred Wood is a 49 y.o. male with history of cocaine abuse, PTSD presenting with L sided numbness.   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  Sinus dz. ASPECTS 10.  MRI  R>L small cerebellar + L thalamic infarcts  CTA head & neck Unremarkable   2D Echo EF 60-65%. No source of embolus   LDL 95  HgbA1c 6.1  VTE prophylaxis - Heparin 5000 units sq tid   No antithrombotic prior to admission, now on aspirin 81 mg daily and clopidogrel 75 mg daily following plavix load. Continue DAPT x 3 weeks then aspirin alone    Therapy recommendations:  OP PT, no SLP  Disposition:  Return home Ok for d/c from stroke standpoint Follow-up Stroke Clinic at [facility] in 4 weeks. Office will call with appointment date and time. Order placed.   Hyperlipidemia  Home meds:  No statin  Add lipitor 40  LDL 95, goal < 70  Continue statin at discharge  PreDiabetes  HgbA1c 6.1, goal < 7.0  Other Stroke Risk Factors  Cigarette smoker, advised to stop smoking  ETOH use, advised to drink no more than 2 drink(s) a day  PolySubstance abuse, cocaine & THC - UDS:  THC POSITIVE, Cocaine POSITIVE. Patient advised to stop using due to stroke risk.  Other Active Problems  PTSD  Hospital day # 2  Recommend aspirin Plavix for 3 weeks followed by aspirin alone.  Patient counseled to quit cocaine, marijuana and cigarettes.  Agree with plan to discharge home.  Stroke team  will sign off.  Kindly call for questions.  Delia Heady, MD To contact Stroke Continuity provider, please refer to [email or web address]. After hours, contact General Neurology

## 2019-11-12 NOTE — Discharge Summary (Signed)
Discharge Summary  Fred Wood TIR:[id number] DOB: 12-Sep-1970  PCP: Patient, No Pcp Per  Admit date: 11/10/2019 Discharge date: 11/12/2019  Time spent:   Recommendations for Outpatient Follow-up:  1. F/u with PCP at  [facility] clinic within a week  for hospital discharge follow up, repeat cbc/bmp at follow up 2. Neurology follow up 3. Outpatient PT referral  Discharge Diagnoses:  Active Hospital Problems   Diagnosis Date Noted  . Acute CVA (cerebrovascular accident) (HCC) 11/10/2019  . Polysubstance abuse (HCC) 11/10/2019    Resolved Hospital Problems  No resolved problems to display.    Discharge Condition: stable  Diet recommendation: heart healthy  There were no vitals filed for this visit.  History of present illness:  Fred Wood  is a 49 y.o. male, with past medical history of polysubstance abuse, tobacco abuse, patient presents to ED secondary to complaints of acute onset of left-sided tingling, numbness, in the head, arm and leg, it did start 30 minutes prior to arrival to ED, as well he does report just minimal weakness in the left side, he denies any previous similar symptoms, denies any slurred speech, altered mental status, loss of consciousness, fever, chills, patient reports history of cocaine abuse, most recently was Friday, where he smoked cocaine.  - IN ED if acute abnormalities, CT head with no acute findings, with his MRI of the brain significant for multiple small acute infarcts in the cerebellum, right greater than left, and the left thalamus, no hemorrhage or mass-effect, patient was seen by neurology, triage hospitalist were consulted to admit.   Hospital Course:  Active Problems:   Acute CVA (cerebrovascular accident) (HCC)   Polysubstance abuse (HCC)  Acute Stroke:   R>L cerebellar and L thalamic infarcts likely related to cocaine use Neurology consulted and recommended:  Code Stroke CT head No acute abnormality.  Sinus dz. ASPECTS 10.     MRI   R>L small cerebellar + L thalamic infarcts  CTA head & neck Unremarkable   2D Echo EF 60-65%. No source of embolus   LDL 95  HgbA1c 6.1  No antithrombotic prior to admission, now on aspirin 81 mg daily and clopidogrel 75 mg daily following plavix load. Continue DAPT x 3 weeks then aspirin alone    Follow-up Stroke Clinic at [facility] in 4 weeks  Hyperlipidemia  Home meds:  No statin  Add lipitor 40  LDL 95, goal < 70  Continue statin at discharge  Psych: reports on depakote 100mg  qhs  Substance abuse:  Cigarette smoker, advised to stop smoking  ETOH use, advised to drink no more than 2 drink(s) a day  PolySubstance abuse, cocaine & THC - UDS:  THC POSITIVE, Cocaine POSITIVE. Patient advised to stop using due to stroke risk  Procedures:  none  Consultations:  Neurology   Discharge Exam: BP (!) 126/92 (BP Location: Right Arm)   Pulse 73   Temp 98.3 F (36.8 C) (Oral)   Resp 18   SpO2 100%   General: NAD Cardiovascular: RRR Respiratory: CTABL  Discharge Instructions You were cared for by a hospitalist during your hospital stay. If you have any questions about your discharge medications or the care you received while you were in the hospital after you are discharged, you can call the unit and asked to speak with the hospitalist on call if the hospitalist that took care of you is not available. Once you are discharged, your primary care physician will handle any further medical issues. Please note  that NO REFILLS for any discharge medications will be authorized once you are discharged, as it is imperative that you return to your primary care physician (or establish a relationship with a primary care physician if you do not have one) for your aftercare needs so that they can reassess your need for medications and monitor your lab values.  Discharge Instructions    Ambulatory referral to Neurology   Complete by: As directed    An appointment  is requested in approximately: 4 weeks Stroke follow up   Ambulatory referral to Physical Therapy   Complete by: As directed    He is hospitalized for acute stroke, Pt recommend outpatient rehab.   Diet - low sodium heart healthy   Complete by: As directed    Increase activity slowly   Complete by: As directed      Allergies as of 11/12/2019   No Known Allergies     Medication List    STOP taking these medications   naphazoline-pheniramine 0.025-0.3 % ophthalmic solution Commonly known as: NAPHCON-A     TAKE these medications   aspirin 81 MG EC tablet Take 1 tablet (81 mg total) by mouth daily. Swallow whole. Start taking on: November 13, 2019   atorvastatin 40 MG tablet Commonly known as: LIPITOR Take 1 tablet (40 mg total) by mouth daily. Start taking on: November 13, 2019   clopidogrel 75 MG tablet Commonly known as: PLAVIX Take 1 tablet (75 mg total) by mouth daily for 21 days. Start taking on: November 13, 2019   divalproex 500 MG 24 hr tablet Commonly known as: DEPAKOTE ER Take 1,000 mg by mouth at bedtime.   ibuprofen 200 MG tablet Commonly known as: ADVIL Take 600-800 mg by mouth every 6 (six) hours as needed for headache (pain).   melatonin 3 MG Tabs tablet Take 6 mg by mouth at bedtime as needed (Sleep).   sildenafil 100 MG tablet Commonly known as: VIAGRA Take 50 mg by mouth daily as needed for erectile dysfunction.   TYLENOL 500 MG tablet Generic drug: acetaminophen Take 1,000 mg by mouth every 6 (six) hours as needed for headache (pain).      No Known Allergies  Follow-up Information    [facility] Follow up in 1 week(s).   Why: hospital discharge follow up Contact information: [address] [facility] [place] [address] [phone]        [facility] Follow up in 4 week(s).   Why: stroke follow up Contact information: [address]     [address] [place]  [address] [phone]       [facility] Follow up.   Specialty: Rehabilitation Contact information: [address] [id number] mc [place] [address] [phone]               The results of significant diagnostics from this hospitalization (including imaging, microbiology, ancillary and laboratory) are listed below for reference.    Significant Diagnostic Studies: MR BRAIN WO CONTRAST  Result Date: 11/10/2019 CLINICAL DATA:  Headache with numbness and tingling on the left side EXAM: MRI HEAD WITHOUT CONTRAST TECHNIQUE: Multiplanar, multiecho pulse sequences of the brain and surrounding structures were obtained without intravenous contrast. COMPARISON:  None. FINDINGS: BRAIN: There are multiple small acute infarcts of the cerebellum, involving both hemispheres, right greater than left. Small vessel infarct of the left thalamus is the only supratentorial infarct. Normal white matter signal. Normal volume of CSF spaces. No chronic microhemorrhage. Normal  midline structures. VASCULAR: Major flow voids are preserved. SKULL AND UPPER CERVICAL SPINE: Normal calvarium and skull base. Visualized upper cervical spine and soft tissues are normal. SINUSES/ORBITS: No paranasal sinus fluid levels or advanced mucosal thickening. No mastoid or middle ear effusion. Normal orbits. 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 No hemorrhage or mass effect. Electronically Signed   By: Deatra RobinsonKevin  Herman M.D.   On: 11/10/2019 21:55   ECHOCARDIOGRAM COMPLETE  Result Date: 11/11/2019    ECHOCARDIOGRAM REPORT   Patient Name:   Sierra E Bryars Date of Exam: 11/11/2019 Medical Rec #:  [id number]     Height:       68.0 in Accession #:    [id number]    Weight:       202.0 lb Date of Birth:  06/23/1970      BSA:          2.052 m Patient Age:    49 years      BP:           112/64 mmHg Patient Gender: M              HR:           69 bpm. Exam Location:  Inpatient Procedure: 2D Echo Indications:    Stroke I163.9  History:        Patient has no prior history of Echocardiogram examinations.                 Risk Factors:Polysubstance Abuse.  Sonographer:    Thurman Coyerasey Kirkpatrick RDCS (AE) Referring Phys: [id number] IMPRESSIONS  1. Left ventricular ejection fraction, by estimation, is 60 to 65%. The left ventricle has normal function. The left ventricle has no regional wall motion abnormalities. Left ventricular diastolic parameters were normal.  2. Right ventricular systolic function is normal. The right ventricular size is normal. Tricuspid regurgitation signal is inadequate for assessing PA pressure.  3. The mitral valve is grossly normal. Trivial mitral valve regurgitation. No evidence of mitral stenosis.  4. The aortic valve is tricuspid. Aortic valve regurgitation is mild. No aortic stenosis is present.  5. The inferior vena cava is normal in size with greater than 50% respiratory variability, suggesting right atrial pressure of 3 mmHg. Conclusion(s)/Recommendation(s): No intracardiac source of embolism detected on this transthoracic study. A transesophageal echocardiogram is recommended to exclude cardiac source of embolism if clinically indicated. FINDINGS  Left Ventricle: Left ventricular ejection fraction, by estimation, is 60 to 65%. The left ventricle has normal function. The left ventricle has no regional wall motion abnormalities. The left ventricular internal cavity size was normal in size. There is  no left ventricular hypertrophy. Left ventricular diastolic parameters were normal. Normal left ventricular filling pressure. Right Ventricle: The right ventricular size is normal. No increase in right ventricular wall thickness. Right ventricular systolic function is normal. Tricuspid regurgitation signal is inadequate for assessing PA pressure. Left Atrium: Left atrial size was normal in size. Right  Atrium: Right atrial size was normal in size. Pericardium: Trivial pericardial effusion is present. Mitral Valve: The mitral valve is grossly normal. Trivial mitral valve regurgitation. No evidence of mitral valve stenosis. Tricuspid Valve: The tricuspid valve is grossly normal. Tricuspid valve regurgitation is trivial. No evidence of tricuspid stenosis. Aortic Valve: The aortic valve is tricuspid. Aortic valve regurgitation is mild. No aortic stenosis is present. Pulmonic Valve: The pulmonic valve was grossly normal. Pulmonic valve regurgitation is not visualized.  No evidence of pulmonic stenosis. Aorta: The aortic root and ascending aorta are structurally normal, with no evidence of dilitation. Venous: The inferior vena cava is normal in size with greater than 50% respiratory variability, suggesting right atrial pressure of 3 mmHg. IAS/Shunts: The atrial septum is grossly normal.  LEFT VENTRICLE PLAX 2D LVIDd:         5.20 cm  Diastology LVIDs:         3.70 cm  LV e' lateral:   7.18 cm/s LV PW:         1.10 cm  LV E/e' lateral: 13.8 LV IVS:        1.00 cm  LV e' medial:    7.97 cm/s LVOT diam:     2.30 cm  LV E/e' medial:  12.4 LV SV:         98 LV SV Index:   48 LVOT Area:     4.15 cm  RIGHT VENTRICLE RV S prime:     14.90 cm/s TAPSE (M-mode): 1.8 cm LEFT ATRIUM             Index       RIGHT ATRIUM           Index LA diam:        3.80 cm 1.85 cm/m  RA Area:     16.30 cm LA Vol (A2C):   50.3 ml 24.51 ml/m RA Volume:   39.00 ml  19.00 ml/m LA Vol (A4C):   42.6 ml 20.76 ml/m LA Biplane Vol: 47.7 ml 23.24 ml/m  AORTIC VALVE LVOT Vmax:   116.00 cm/s LVOT Vmean:  68.500 cm/s LVOT VTI:    0.235 m  AORTA Ao Root diam: 3.60 cm MITRAL VALVE MV Area (PHT): 2.87 cm    SHUNTS MV Decel Time: 264 msec    Systemic VTI:  0.24 m MV E velocity: 99.00 cm/s  Systemic Diam: 2.30 cm MV A velocity: 69.40 cm/s MV E/A ratio:  1.43 Lennie Odor MD Electronically signed by Lennie Odor MD Signature Date/Time: 11/11/2019/11:30:45 AM     Final    CT HEAD CODE STROKE WO CONTRAST  Result Date: 11/10/2019 CLINICAL DATA:  Code stroke.  Left-sided weakness. EXAM: CT HEAD WITHOUT CONTRAST TECHNIQUE: Contiguous axial images were obtained from the base of the skull through the vertex without intravenous contrast. COMPARISON:  Head CT 11/30/2008 FINDINGS: Brain: Cerebral volume is normal. There is no acute intracranial hemorrhage. No demarcated cortical infarct is identified. No extra-axial fluid collection. No evidence of intracranial mass. No midline shift. Vascular: No hyperdense vessel is identified. Skull: Normal. Negative for fracture or focal lesion. Sinuses/Orbits: Visualized orbits show no acute finding. Mild ethmoid and maxillary sinus mucosal thickening. Small left maxillary sinus mucous retention cyst. No significant mastoid effusion ASPECTS ([place]berta Stroke Program Early CT Score) - Ganglionic level infarction (caudate, lentiform nuclei, internal capsule, insula, M1-M3 cortex): 7 - Supraganglionic infarction (M4-M6 cortex): 3 Total score (0-10 with 10 being normal): 10 These results were called by telephone at the time of interpretation on 11/10/2019 at 8:35 pm to provider Dr. Amada Jupiter, who verbally acknowledged these results. IMPRESSION: 1. No CT evidence of acute intracranial abnormality. ASPECTS is 10. 2. Mild ethmoid and maxillary sinus mucosal thickening. Small left maxillary sinus mucous retention cyst. Electronically Signed   By: Jackey Loge DO   On: 11/10/2019 20:36   CT ANGIO HEAD CODE STROKE  Result Date: 11/10/2019 CLINICAL DATA:  Stroke follow-up. Dizziness and left-sided numbness. EXAM: CT ANGIOGRAPHY HEAD  AND NECK TECHNIQUE: Multidetector CT imaging of the head and neck was performed using the standard protocol during bolus administration of intravenous contrast. Multiplanar CT image reconstructions and MIPs were obtained to evaluate the vascular anatomy. Carotid stenosis measurements (when applicable) are obtained  utilizing NASCET criteria, using the distal internal carotid diameter as the denominator. CONTRAST:  77mL OMNIPAQUE IOHEXOL 350 MG/ML SOLN COMPARISON:  None. FINDINGS: CTA NECK FINDINGS SKELETON: There is no bony spinal canal stenosis. No lytic or blastic lesion. OTHER NECK: Normal pharynx, larynx and major salivary glands. No cervical lymphadenopathy. Unremarkable thyroid gland. UPPER CHEST: No pneumothorax or pleural effusion. No nodules or masses. AORTIC ARCH: There is no calcific atherosclerosis of the aortic arch. There is no aneurysm, dissection or hemodynamically significant stenosis of the visualized portion of the aorta. Conventional 3 vessel aortic branching pattern. The visualized proximal subclavian arteries are widely patent. RIGHT CAROTID SYSTEM: Normal without aneurysm, dissection or stenosis. LEFT CAROTID SYSTEM: Normal without aneurysm, dissection or stenosis. VERTEBRAL ARTERIES: Left dominant configuration. Both origins are clearly patent. There is no dissection, occlusion or flow-limiting stenosis to the skull base (V1-V3 segments). CTA HEAD FINDINGS POSTERIOR CIRCULATION: --Vertebral arteries: Normal V4 segments. --Inferior cerebellar arteries: Normal. --Basilar artery: Normal. --Superior cerebellar arteries: Normal. --Posterior cerebral arteries (PCA): Normal. ANTERIOR CIRCULATION: --Intracranial internal carotid arteries: Normal. --Anterior cerebral arteries (ACA): Normal. Both A1 segments are present. Patent anterior communicating artery (a-comm). --Middle cerebral arteries (MCA): Normal. VENOUS SINUSES: As permitted by contrast timing, patent. ANATOMIC VARIANTS: Fetal origins of both posterior cerebral arteries. Review of the MIP images confirms the above findings. IMPRESSION: Normal CTA of the head and neck. Electronically Signed   By: Deatra Robinson M.D.   On: 11/10/2019 23:36   CT ANGIO NECK CODE STROKE  Result Date: 11/10/2019 CLINICAL DATA:  Stroke follow-up. Dizziness and left-sided  numbness. EXAM: CT ANGIOGRAPHY HEAD AND NECK TECHNIQUE: Multidetector CT imaging of the head and neck was performed using the standard protocol during bolus administration of intravenous contrast. Multiplanar CT image reconstructions and MIPs were obtained to evaluate the vascular anatomy. Carotid stenosis measurements (when applicable) are obtained utilizing NASCET criteria, using the distal internal carotid diameter as the denominator. CONTRAST:  41mL OMNIPAQUE IOHEXOL 350 MG/ML SOLN COMPARISON:  None. FINDINGS: CTA NECK FINDINGS SKELETON: There is no bony spinal canal stenosis. No lytic or blastic lesion. OTHER NECK: Normal pharynx, larynx and major salivary glands. No cervical lymphadenopathy. Unremarkable thyroid gland. UPPER CHEST: No pneumothorax or pleural effusion. No nodules or masses. AORTIC ARCH: There is no calcific atherosclerosis of the aortic arch. There is no aneurysm, dissection or hemodynamically significant stenosis of the visualized portion of the aorta. Conventional 3 vessel aortic branching pattern. The visualized proximal subclavian arteries are widely patent. RIGHT CAROTID SYSTEM: Normal without aneurysm, dissection or stenosis. LEFT CAROTID SYSTEM: Normal without aneurysm, dissection or stenosis. VERTEBRAL ARTERIES: Left dominant configuration. Both origins are clearly patent. There is no dissection, occlusion or flow-limiting stenosis to the skull base (V1-V3 segments). CTA HEAD FINDINGS POSTERIOR CIRCULATION: --Vertebral arteries: Normal V4 segments. --Inferior cerebellar arteries: Normal. --Basilar artery: Normal. --Superior cerebellar arteries: Normal. --Posterior cerebral arteries (PCA): Normal. ANTERIOR CIRCULATION: --Intracranial internal carotid arteries: Normal. --Anterior cerebral arteries (ACA): Normal. Both A1 segments are present. Patent anterior communicating artery (a-comm). --Middle cerebral arteries (MCA): Normal. VENOUS SINUSES: As permitted by contrast timing, patent.  ANATOMIC VARIANTS: Fetal origins of both posterior cerebral arteries. Review of the MIP images confirms the above findings. IMPRESSION: Normal CTA of the head and neck. Electronically  Signed   By: Deatra Robinson M.D.   On: 11/10/2019 23:36    Microbiology: Recent Results (from the past 240 hour(s))  SARS Coronavirus 2 by RT PCR (hospital order, performed in [facility] hospital lab) Nasopharyngeal Nasopharyngeal Swab     Status: None   Collection Time: 11/10/19 10:33 PM   Specimen: Nasopharyngeal Swab  Result Value Ref Range Status   SARS Coronavirus 2 NEGATIVE NEGATIVE Final    Comment: (NOTE) SARS-CoV-2 target nucleic acids are NOT DETECTED.  The SARS-CoV-2 RNA is generally detectable in upper and lower respiratory specimens during the acute phase of infection. The lowest concentration of SARS-CoV-2 viral copies this assay can detect is 250 copies / mL. A negative result does not preclude SARS-CoV-2 infection and should not be used as the sole basis for treatment or other patient management decisions.  A negative result may occur with improper specimen collection / handling, submission of specimen other than nasopharyngeal swab, presence of viral mutation(s) within the areas targeted by this assay, and inadequate number of viral copies (<250 copies / mL). A negative result must be combined with clinical observations, patient history, and epidemiological information.  Fact Sheet for Patients:   BoilerBrush.com.cy  Fact Sheet for Healthcare Providers: https://pope.com/  This test is not yet approved or  cleared by the [place] FDA and has been authorized for detection and/or diagnosis of SARS-CoV-2 by FDA under an Emergency Use Authorization (EUA).  This EUA will remain in effect (meaning this test can be used) for the duration of the COVID-19 declaration under Section 564(b)(1) of the Act, 21 U.S.C. section 360bbb-3(b)(1), unless  the authorization is terminated or revoked sooner.  Performed at [facility], [address] [place], [place] [address]      Labs: Basic Metabolic Panel: Recent Labs  Lab 11/10/19 2020 11/10/19 2027 11/10/19 2259 11/11/19 0555  NA 139 140  --  140  K 3.8 3.7  --  3.9  CL 106 105  --  106  CO2 25  --   --  26  GLUCOSE 130* 131*  --  102*  BUN 9 9  --  7  CREATININE 1.06 1.00 1.10 1.06  CALCIUM 8.4*  --   --  8.1*   Liver Function Tests: Recent Labs  Lab 11/10/19 2020 11/11/19 0555  AST 20 16  ALT 15 16  ALKPHOS 47 39  BILITOT 0.8 0.5  PROT 6.7 5.8*  ALBUMIN 3.4* 3.0*   No results for input(s): LIPASE, AMYLASE in the last 168 hours. No results for input(s): AMMONIA in the last 168 hours. CBC: Recent Labs  Lab 11/10/19 2020 11/10/19 2027 11/11/19 0555  WBC 8.4  --  6.0  NEUTROABS 5.0  --   --   HGB 14.8 16.0 14.8  HCT 46.9 47.0 47.1  MCV 90.2  --  89.7  PLT 350  --  311   Cardiac Enzymes: No results for input(s): CKTOTAL, CKMB, CKMBINDEX, TROPONINI in the last 168 hours. BNP: BNP (last 3 results) No results for input(s): BNP in the last 8760 hours.  ProBNP (last 3 results) No results for input(s): PROBNP in the last 8760 hours.  CBG: Recent Labs  Lab 11/10/19 2029  GLUCAP 120*       Signed:  Albertine Grates MD, PhD, FACP  [facility] 11/12/2019, 10:23 AM

## 2020-01-15 ENCOUNTER — Encounter: Payer: Self-pay | Admitting: Neurology

## 2020-01-15 ENCOUNTER — Ambulatory Visit (INDEPENDENT_AMBULATORY_CARE_PROVIDER_SITE_OTHER): Payer: No Typology Code available for payment source | Admitting: Neurology

## 2020-01-15 ENCOUNTER — Other Ambulatory Visit: Payer: Self-pay

## 2020-01-15 VITALS — BP 126/85 | HR 85 | Ht 69.0 in | Wt 227.0 lb

## 2020-01-15 DIAGNOSIS — R202 Paresthesia of skin: Secondary | ICD-10-CM | POA: Diagnosis not present

## 2020-01-15 DIAGNOSIS — I639 Cerebral infarction, unspecified: Secondary | ICD-10-CM | POA: Diagnosis not present

## 2020-01-15 MED ORDER — TOPIRAMATE 50 MG PO TABS: 50.0000 mg | ORAL_TABLET | ORAL | 2 refills | Status: AC

## 2020-01-15 MED ORDER — TOPIRAMATE 50 MG PO TABS
50.0000 mg | ORAL_TABLET | ORAL | 2 refills | Status: DC
Start: 2020-01-15 — End: 2020-01-15

## 2020-01-15 NOTE — Progress Notes (Signed)
[facility] [address] [place]. [place] [address] [phone]       OFFICE FOLLOW-UP NOTE  Mr. DELEON PASSE Date of Birth:  December 01, 1970 Medical Record Number:  [id number]   HPI: Mr. Knierim is a 49 year old African-American male seen today for initial office follow-up visit following hospital admission for stroke in July 2021. History is obtained from the patient, review of electronic medical records and I personally reviewed pertinent imaging films in PACS. He has a past medical history of polysubstance abuse, tobacco abuse who presented to [facility] on 11/10/2019 with sudden onset of left-sided tingling and numbness involving the face arm and leg CT scan of the head in the ED showed no acute abnormality but MRI scan showed multiple small acute infarcts involving bilateral cerebellum right greater than left as well as left thalamus. CT angiogram of the brain and neck showed no significant large vessel stenosis or occlusion. Transthoracic echo showed normal ejection fraction without cardiac source of embolism. LDL cholesterol was 95 mg percent and hemoglobin A1c was 6.1. Patient was counseled to quit smoking cigarettes, marijuana and cocaine use and started on aspirin and Plavix. Patient states is done well since discharge. His gait and balance have improved. He still has residual left-sided tingling and numbness which is bothersome especially at night. He is tolerating aspirin well without bruising or bleeding. He states he is quit smoking completely. However he has gained some weight. He is also stopped alcohol and cigarettes. He is getting better grades in school and having better times in interacting with his family. He is tolerating Lipitor well without muscle aches and pains. His blood pressures well controlled and today it is 126/85. Patient is on Depakote ER 1 g daily for bipolar disorder of through the [place] wants to come off it.  ROS:   14 system review of systems is  positive for numbness, tingling, weight gain, gait imbalance and all other systems negative  PMH:  Past Medical History:  Diagnosis Date  . Cocaine abuse (HCC)   . Marijuana abuse   . Osteoarthritis of left shoulder   . Polysubstance abuse (HCC)   . PTSD (post-traumatic stress disorder)     Social History:  Social History   Socioeconomic History  . Marital status: Single    Spouse name: Not on file  . Number of children: Not on file  . Years of education: Not on file  . Highest education level: Not on file  Occupational History  . Not on file  Tobacco Use  . Smoking status: Current Every Day Smoker    Packs/day: 1.00    Years: 5.00    Pack years: 5.00    Types: Cigarettes  . Smokeless tobacco: Never Used  Substance and Sexual Activity  . Alcohol use: Yes    Alcohol/week: 0.0 standard drinks  . Drug use: No  . Sexual activity: Not on file  Other Topics Concern  . Not on file  Social History Narrative  . Not on file   Social Determinants of Health   Financial Resource Strain:   . Difficulty of Paying Living Expenses: Not on file  Food Insecurity:   . Worried About Programme researcher, broadcasting/film/video in the Last Year: Not on file  . Ran Out of Food in the Last Year: Not on file  Transportation Needs:   . Lack of Transportation (Medical): Not on file  . Lack of Transportation (Non-Medical): Not on file  Physical Activity:   . Days  of Exercise per Week: Not on file  . Minutes of Exercise per Session: Not on file  Stress:   . Feeling of Stress : Not on file  Social Connections:   . Frequency of Communication with Friends and Family: Not on file  . Frequency of Social Gatherings with Friends and Family: Not on file  . Attends Religious Services: Not on file  . Active Member of Clubs or Organizations: Not on file  . Attends Banker Meetings: Not on file  . Marital Status: Not on file  Intimate Partner Violence:   . Fear of Current or Ex-Partner: Not on file  .  Emotionally Abused: Not on file  . Physically Abused: Not on file  . Sexually Abused: Not on file    Medications:   Current Outpatient Medications on File Prior to Visit  Medication Sig Dispense Refill  . acetaminophen (TYLENOL) 500 MG tablet Take 1,000 mg by mouth every 6 (six) hours as needed for headache (pain).    Marland Kitchen aspirin 81 MG EC tablet Take 1 tablet (81 mg total) by mouth daily. Swallow whole. 30 tablet 0  . atorvastatin (LIPITOR) 40 MG tablet Take 1 tablet (40 mg total) by mouth daily. 30 tablet 0  . divalproex (DEPAKOTE ER) 500 MG 24 hr tablet Take 1,000 mg by mouth at bedtime.    . melatonin 3 MG TABS tablet Take 6 mg by mouth at bedtime as needed (Sleep).    . sildenafil (VIAGRA) 100 MG tablet Take 50 mg by mouth daily as needed for erectile dysfunction.     No current facility-administered medications on file prior to visit.    Allergies:  No Known Allergies  Physical Exam General: Mildly obese middle-aged African-American male seated, in no evident distress Head: head normocephalic and atraumatic.  Neck: supple with no carotid or supraclavicular bruits Cardiovascular: regular rate and rhythm, no murmurs Musculoskeletal: no deformity Skin:  no rash/petichiae Vascular:  Normal pulses all extremities Vitals:   01/15/20 1001  BP: 126/85  Pulse: 85   Neurologic Exam Mental Status: Awake and fully alert. Oriented to place and time. Recent and remote memory intact. Attention span, concentration and fund of knowledge appropriate. Mood and affect appropriate.  Cranial Nerves: Fundoscopic exam reveals sharp disc margins. Pupils equal, briskly reactive to light. Extraocular movements full without nystagmus but there is mild saccadic dysmetria.. Visual fields full to confrontation. Hearing intact. Facial sensation intact. Face, tongue, palate moves normally and symmetrically.  Motor: Normal bulk and tone. Normal strength in all tested extremity muscles. Sensory.: Diminished  touch ,pinprick sensation on left upper and lower extremity but intact.position and vibratory sensation.  Coordination: Rapid alternating movements normal in all extremities. Finger-to-nose and heel-to-shin performed accurately bilaterally. Gait and Station: Arises from chair without difficulty. Stance is normal. Gait demonstrates normal stride length and balance . Able to heel, toe and tandem walk with moderate  difficulty.  Reflexes: 1+ and symmetric. Toes downgoing.   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 Vascular risk factors of smoking, substance abuse, hyperlipidemia and obesity.    PLAN: I had a long d/w patient about his recent strokes, risk for recurrent stroke/TIAs, personally independently reviewed imaging studies and stroke evaluation results and answered questions.Continue aspirin 81 mg daily  for secondary stroke prevention and maintain strict control of hypertension with blood pressure goal below 130/90, diabetes with hemoglobin  A1c goal below 6.5% and lipids with LDL cholesterol goal below 70 mg/dL. I also advised the patient to eat a healthy diet with plenty of whole grains, cereals, fruits and vegetables, exercise regularly and maintain ideal body weight. I have complemented the patient on quitting cocaine marijuana and smoking and alcohol advised him to continue to do so. Start Topamax 50 mg at bedtime to help with his post stroke paresthesias. I advised him to follow-up with his physician at the [facility] for his Depakote for bipolar disorder as he feels he needs to come off it. Followup in the future with my nurse practitioner Shanda Bumps in 3 months or call earlier if necessary. Greater than 50% of time during this 25 minute visit was spent on counseling,explanation of diagnosis of strokes and discussion about stroke prevention and treatment and  answering questions, planning of further management, discussion with patient and family and coordination of care Delia Heady, MD  [facility] [address] [place], [place] [address]  Phone [phone] Fax [phone] Note: This document was prepared with digital dictation and possible smart phrase technology. Any transcriptional errors that result from this process are unintentional

## 2020-01-15 NOTE — Patient Instructions (Signed)
I had a long d/w patient about his recent strokes, risk for recurrent stroke/TIAs, personally independently reviewed imaging studies and stroke evaluation results and answered questions.Continue aspirin 81 mg daily  for secondary stroke prevention and maintain strict control of hypertension with blood pressure goal below 130/90, diabetes with hemoglobin A1c goal below 6.5% and lipids with LDL cholesterol goal below 70 mg/dL. I also advised the patient to eat a healthy diet with plenty of whole grains, cereals, fruits and vegetables, exercise regularly and maintain ideal body weight. I have complemented the patient on quitting cocaine marijuana and smoking and alcohol advised him to continue to do so. Start Topamax 50 mg at bedtime to help with his post stroke paresthesias. I advised him to follow-up with his physician at the [facility] for his Depakote for bipolar disorder as he feels he needs to come off it. Followup in the future with my nurse practitioner Shanda Bumps in 3 months or call earlier if necessary.  Stroke Prevention Some medical conditions and behaviors are associated with a higher chance of having a stroke. You can help prevent a stroke by making nutrition, lifestyle, and other changes, including managing any medical conditions you may have. What nutrition changes can be made?   Eat healthy foods. You can do this by: ? Choosing foods high in fiber, such as fresh fruits and vegetables and whole grains. ? Eating at least 5 or more servings of fruits and vegetables a day. Try to fill half of your plate at each meal with fruits and vegetables. ? Choosing lean protein foods, such as lean cuts of meat, poultry without skin, fish, tofu, beans, and nuts. ? Eating low-fat dairy products. ? Avoiding foods that are high in salt (sodium). This can help lower blood pressure. ? Avoiding foods that have saturated fat, trans fat, and cholesterol. This can help prevent high cholesterol. ? Avoiding processed and  premade foods.  Follow your health care provider's specific guidelines for losing weight, controlling high blood pressure (hypertension), lowering high cholesterol, and managing diabetes. These may include: ? Reducing your daily calorie intake. ? Limiting your daily sodium intake to 1,500 milligrams (mg). ? Using only healthy fats for cooking, such as olive oil, canola oil, or sunflower oil. ? Counting your daily carbohydrate intake. What lifestyle changes can be made?  Maintain a healthy weight. Talk to your health care provider about your ideal weight.  Get at least 30 minutes of moderate physical activity at least 5 days a week. Moderate activity includes brisk walking, biking, and swimming.  Do not use any products that contain nicotine or tobacco, such as cigarettes and e-cigarettes. If you need help quitting, ask your health care provider. It may also be helpful to avoid exposure to secondhand smoke.  Limit alcohol intake to no more than 1 drink a day for nonpregnant women and 2 drinks a day for men. One drink equals 12 oz of beer, 5 oz of wine, or 1 oz of hard liquor.  Stop any illegal drug use.  Avoid taking birth control pills. Talk to your health care provider about the risks of taking birth control pills if: ? You are over 79 years old. ? You smoke. ? You get migraines. ? You have ever had a blood clot. What other changes can be made?  Manage your cholesterol levels. ? Eating a healthy diet is important for preventing high cholesterol. If cholesterol cannot be managed through diet alone, you may also need to take medicines. ? Take any prescribed  medicines to control your cholesterol as told by your health care provider.  Manage your diabetes. ? Eating a healthy diet and exercising regularly are important parts of managing your blood sugar. If your blood sugar cannot be managed through diet and exercise, you may need to take medicines. ? Take any prescribed medicines to  control your diabetes as told by your health care provider.  Control your hypertension. ? To reduce your risk of stroke, try to keep your blood pressure below 130/80. ? Eating a healthy diet and exercising regularly are an important part of controlling your blood pressure. If your blood pressure cannot be managed through diet and exercise, you may need to take medicines. ? Take any prescribed medicines to control hypertension as told by your health care provider. ? Ask your health care provider if you should monitor your blood pressure at home. ? Have your blood pressure checked every year, even if your blood pressure is normal. Blood pressure increases with age and some medical conditions.  Get evaluated for sleep disorders (sleep apnea). Talk to your health care provider about getting a sleep evaluation if you snore a lot or have excessive sleepiness.  Take over-the-counter and prescription medicines only as told by your health care provider. Aspirin or blood thinners (antiplatelets or anticoagulants) may be recommended to reduce your risk of forming blood clots that can lead to stroke.  Make sure that any other medical conditions you have, such as atrial fibrillation or atherosclerosis, are managed. What are the warning signs of a stroke? The warning signs of a stroke can be easily remembered as BEFAST.  B is for balance. Signs include: ? Dizziness. ? Loss of balance or coordination. ? Sudden trouble walking.  E is for eyes. Signs include: ? A sudden change in vision. ? Trouble seeing.  F is for face. Signs include: ? Sudden weakness or numbness of the face. ? The face or eyelid drooping to one side.  A is for arms. Signs include: ? Sudden weakness or numbness of the arm, usually on one side of the body.  S is for speech. Signs include: ? Trouble speaking (aphasia). ? Trouble understanding.  T is for time. ? These symptoms may represent a serious problem that is an emergency.  Do not wait to see if the symptoms will go away. Get medical help right away. Call your local emergency services (911 in the [place] Do not drive yourself to the hospital.  Other signs of stroke may include: ? A sudden, severe headache with no known cause. ? Nausea or vomiting. ? Seizure. Where to find more information For more information, visit:  American Stroke Association: www.strokeassociation.org  National Stroke Association: www.stroke.org Summary  You can prevent a stroke by eating healthy, exercising, not smoking, limiting alcohol intake, and managing any medical conditions you may have.  Do not use any products that contain nicotine or tobacco, such as cigarettes and e-cigarettes. If you need help quitting, ask your health care provider. It may also be helpful to avoid exposure to secondhand smoke.  Remember BEFAST for warning signs of stroke. Get help right away if you or a loved one has any of these signs. This information is not intended to replace advice given to you by your health care provider. Make sure you discuss any questions you have with your health care provider. Document Revised: 03/23/2017 Document Reviewed: 05/16/2016 Elsevier Patient Education  2020 [employer].

## 2020-05-06 ENCOUNTER — Telehealth: Payer: Self-pay | Admitting: Adult Health

## 2020-05-06 NOTE — Telephone Encounter (Signed)
LVM: FYI: Notified Pt of bad weather on Sunday. Ask patient to call the office on Monday before leaving your home to make sure the office is open.

## 2020-05-10 ENCOUNTER — Ambulatory Visit: Payer: Non-veteran care | Admitting: Adult Health

## 2021-06-30 IMAGING — CT CT HEAD CODE STROKE
3 series · 15 of 47 positions shown, 18 images · non-contrast
Comparison: Head CT 11/30/2008

CLINICAL DATA: Code stroke.  Left-sided weakness.

EXAM:
CT HEAD WITHOUT CONTRAST
TECHNIQUE: Contiguous axial images were obtained from the base of the skull
through the vertex without intravenous contrast.

[Series 3: head 5.0 st · axial · 0.49mm/px · z∈[-88,+37]mm · 9 of 31 slices shown, 12 images]
[im 3/31  brain]
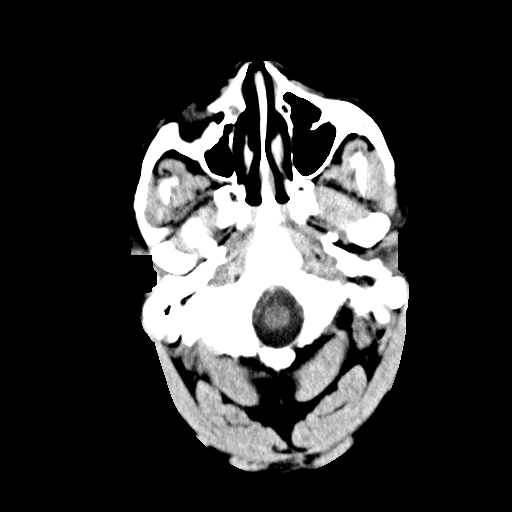
[im 3/31  bone]
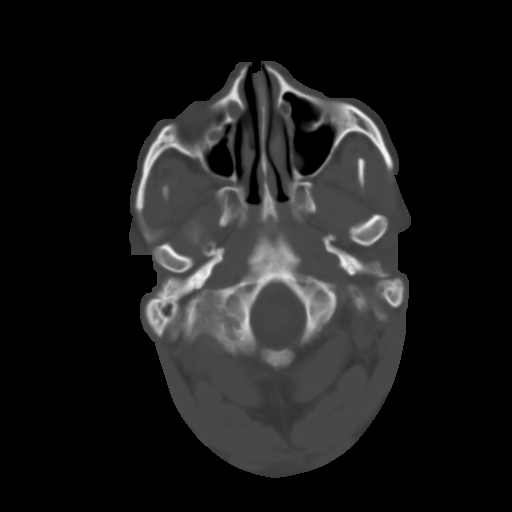
[im 6/31  brain]
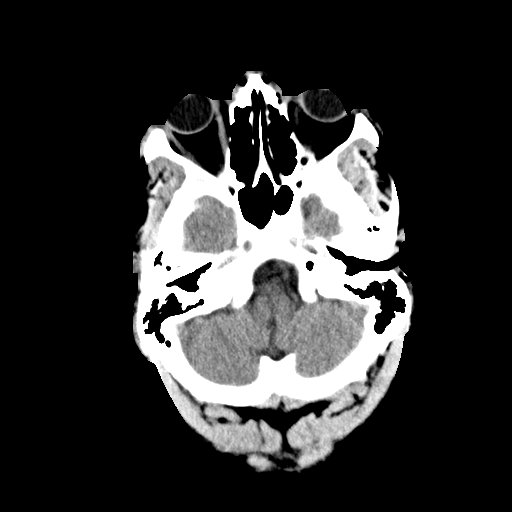
[im 9/31  brain]
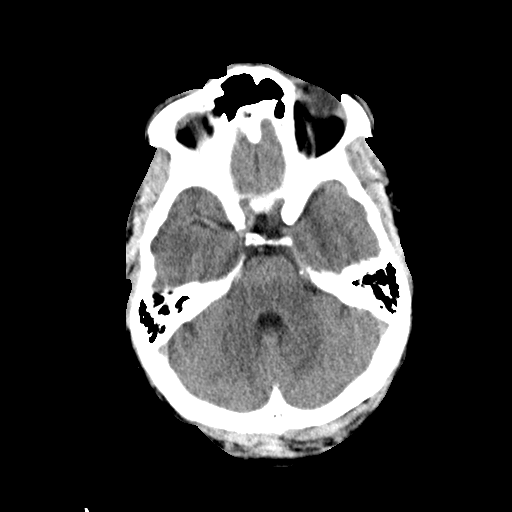
[im 12/31  brain]
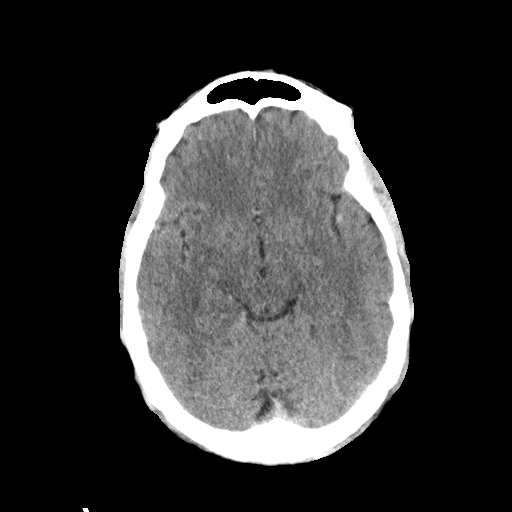
[im 16/31  brain]
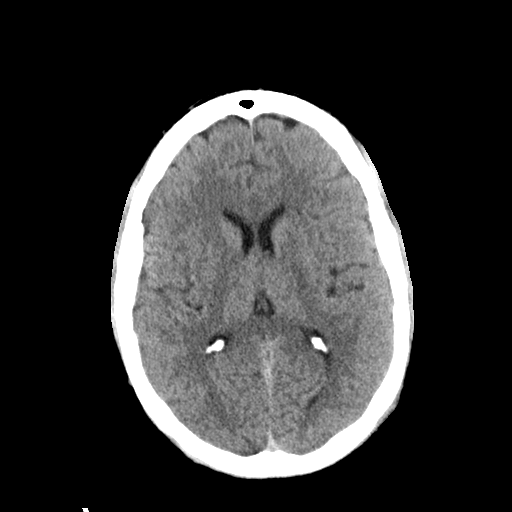
[im 16/31  bone]
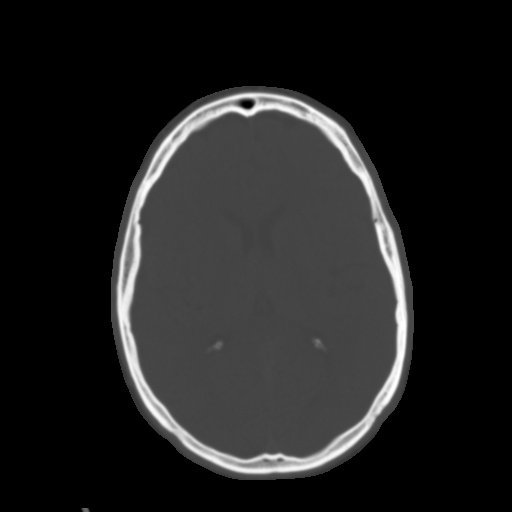
[im 19/31  brain]
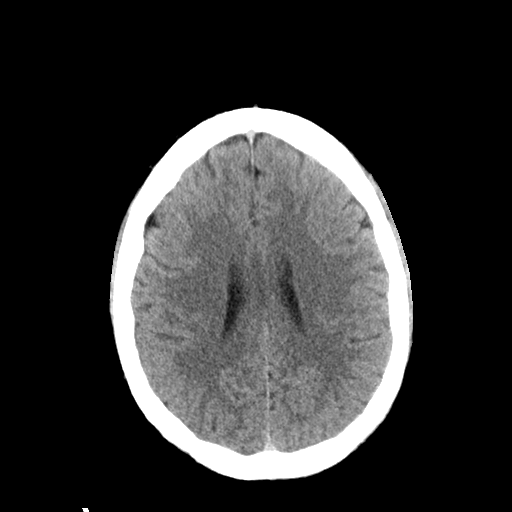
[im 22/31  brain]
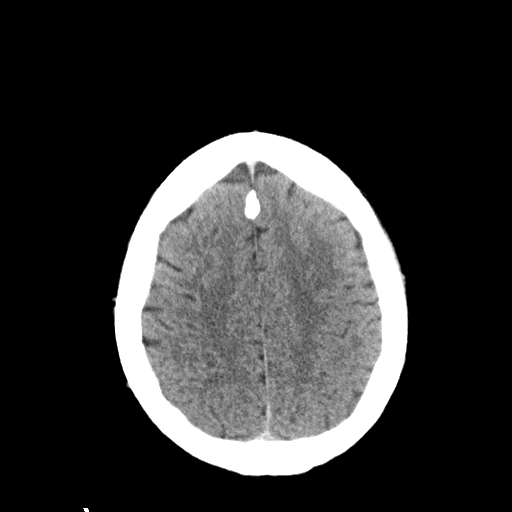
[im 25/31  brain]
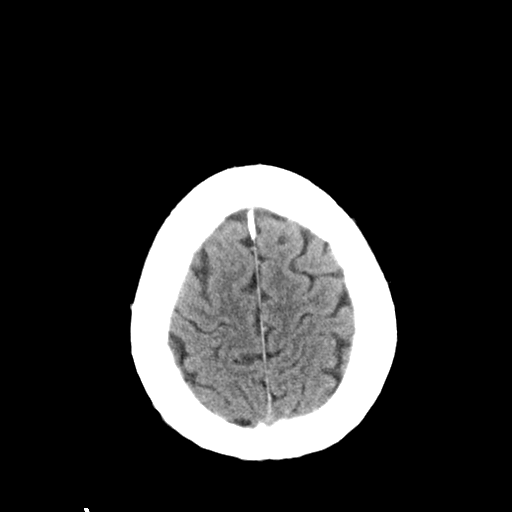
[im 28/31  brain]
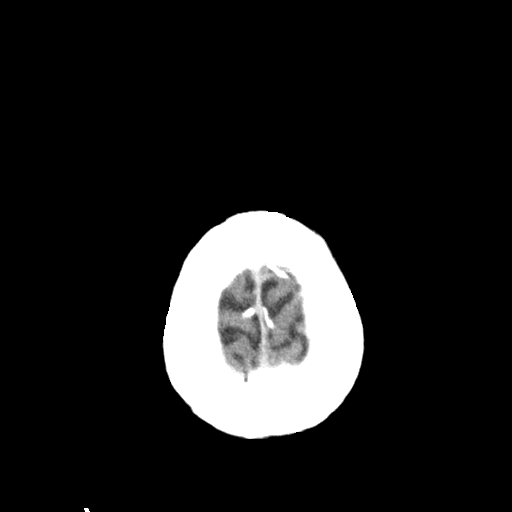
[im 28/31  bone]
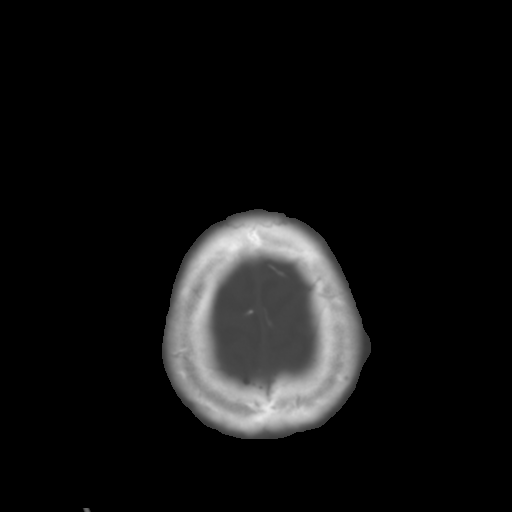

[Series 5: head 3.0 cor st · coronal · 0.32mm/px · 3 of 72 slices shown]
[im 24/72  brain]
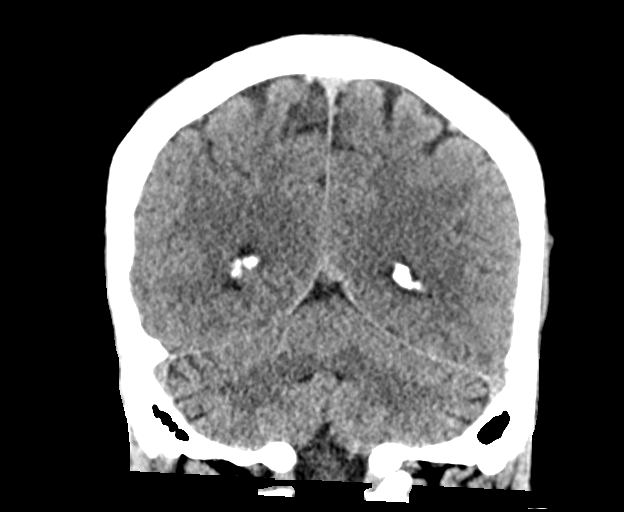
[im 32/72  brain]
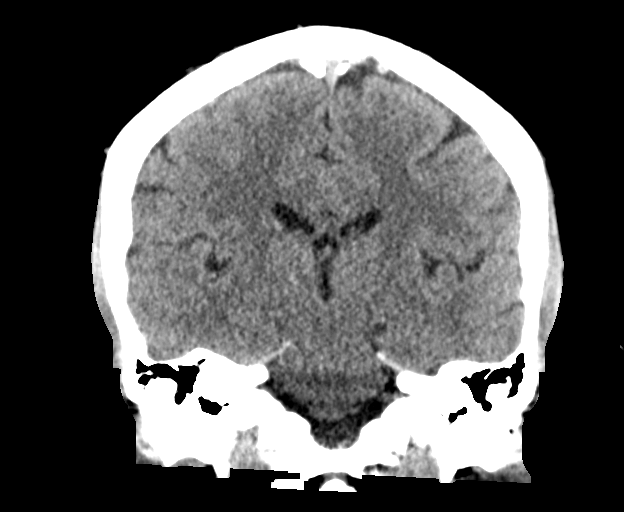
[im 40/72  brain]
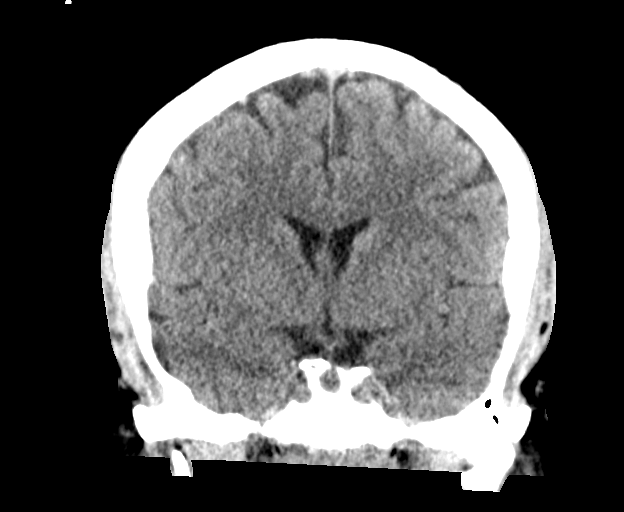

[Series 6: head 3.0 sag st · sagittal · 0.29mm/px · 3 of 67 slices shown]
[im 23/67  brain]
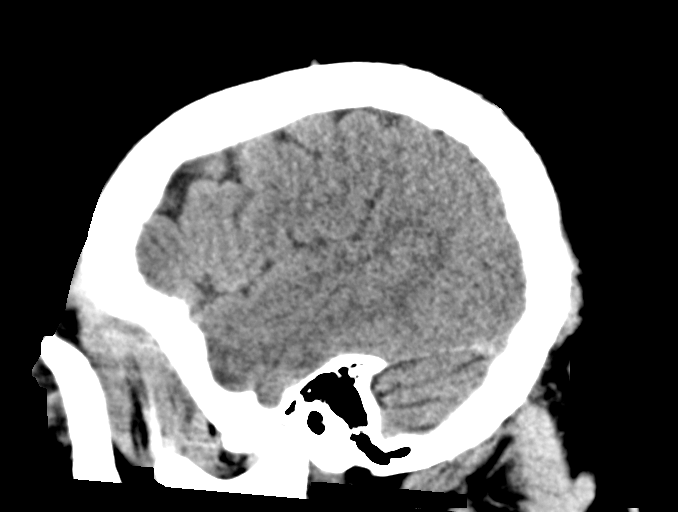
[im 34/67  brain]
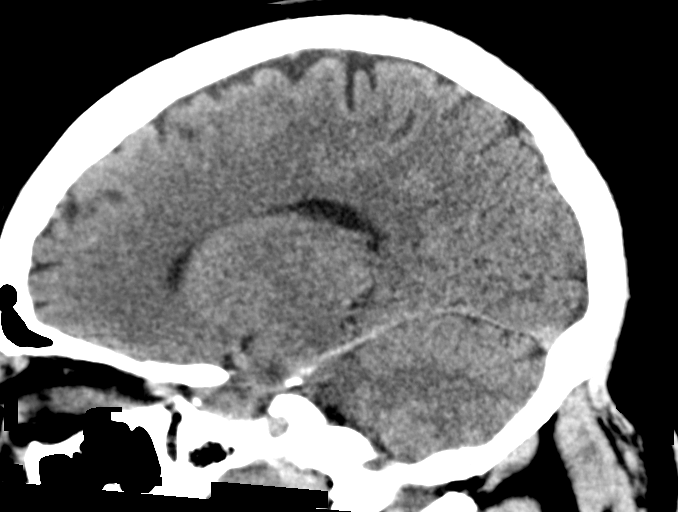
[im 44/67  brain]
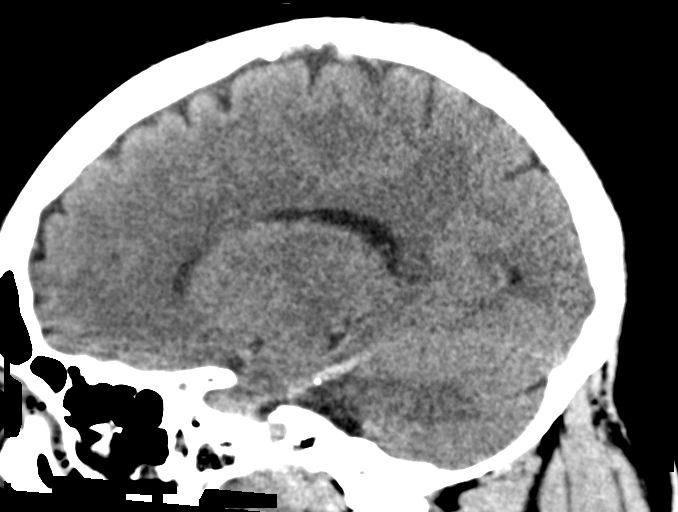

[15 of 47 positions shown; findings below may reference images not displayed]

FINDINGS: Brain:

Cerebral volume is normal.

There is no acute intracranial hemorrhage.

No demarcated cortical infarct is identified.

No extra-axial fluid collection.

No evidence of intracranial mass.

No midline shift.

Vascular: No hyperdense vessel is identified.

Skull: Normal. Negative for fracture or focal lesion.

Sinuses/Orbits: Visualized orbits show no acute finding. Mild
ethmoid and maxillary sinus mucosal thickening. Small left maxillary
sinus mucous retention cyst. No significant mastoid effusion

ASPECTS (Alberta Stroke Program Early CT Score)

- Ganglionic level infarction (caudate, lentiform nuclei, internal
capsule, insula, M1-M3 cortex): 7

- Supraganglionic infarction (M4-M6 cortex): 3

Total score (0-10 with 10 being normal): 10

These results were called by telephone at the time of interpretation
on 11/10/2019 at [DATE] to provider Dr. Kei, who verbally
acknowledged these results.
IMPRESSION: 1. No CT evidence of acute intracranial abnormality. ASPECTS is 10.
2. Mild ethmoid and maxillary sinus mucosal thickening. Small left
maxillary sinus mucous retention cyst.

## 2022-04-07 ENCOUNTER — Emergency Department (HOSPITAL_COMMUNITY)
Admission: EM | Admit: 2022-04-07 | Discharge: 2022-04-07 | Discharge status: Against Medical Advice | Payer: No Typology Code available for payment source | Attending: Emergency Medicine | Admitting: Emergency Medicine

## 2022-04-07 ENCOUNTER — Encounter (HOSPITAL_COMMUNITY): Payer: Self-pay

## 2022-04-07 ENCOUNTER — Other Ambulatory Visit: Payer: Self-pay

## 2022-04-07 DIAGNOSIS — F1010 Alcohol abuse, uncomplicated: Secondary | ICD-10-CM | POA: Diagnosis not present

## 2022-04-07 DIAGNOSIS — R5383 Other fatigue: Secondary | ICD-10-CM | POA: Diagnosis present

## 2022-04-07 DIAGNOSIS — Y903 Blood alcohol level of 60-79 mg/100 ml: Secondary | ICD-10-CM | POA: Diagnosis not present

## 2022-04-07 DIAGNOSIS — Z5321 Procedure and treatment not carried out due to patient leaving prior to being seen by health care provider: Secondary | ICD-10-CM | POA: Diagnosis not present

## 2022-04-07 DIAGNOSIS — F418 Other specified anxiety disorders: Secondary | ICD-10-CM | POA: Insufficient documentation

## 2022-04-07 LAB — CBC WITH DIFFERENTIAL/PLATELET
Abs Immature Granulocytes: 0.02 10*3/uL (ref 0.00–0.07)
Basophils Absolute: 0.1 10*3/uL (ref 0.0–0.1)
Basophils Relative: 1 %
Eosinophils Absolute: 0.1 10*3/uL (ref 0.0–0.5)
Eosinophils Relative: 1 %
HCT: 42.7 % (ref 39.0–52.0)
Hemoglobin: 13.9 g/dL (ref 13.0–17.0)
Immature Granulocytes: 0 %
Lymphocytes Relative: 27 %
Lymphs Abs: 2 10*3/uL (ref 0.7–4.0)
MCH: 27.7 pg (ref 26.0–34.0)
MCHC: 32.6 g/dL (ref 30.0–36.0)
MCV: 85.1 fL (ref 80.0–100.0)
Monocytes Absolute: 0.5 10*3/uL (ref 0.1–1.0)
Monocytes Relative: 7 %
Neutro Abs: 4.7 10*3/uL (ref 1.7–7.7)
Neutrophils Relative %: 64 %
Platelets: 391 10*3/uL (ref 150–400)
RBC: 5.02 MIL/uL (ref 4.22–5.81)
RDW: 15.8 % — ABNORMAL HIGH (ref 11.5–15.5)
WBC: 7.4 10*3/uL (ref 4.0–10.5)
nRBC: 0 % (ref 0.0–0.2)

## 2022-04-07 LAB — COMPREHENSIVE METABOLIC PANEL
ALT: 16 U/L (ref 0–44)
AST: 20 U/L (ref 15–41)
Albumin: 4.1 g/dL (ref 3.5–5.0)
Alkaline Phosphatase: 34 U/L — ABNORMAL LOW (ref 38–126)
Anion gap: 11 (ref 5–15)
BUN: 10 mg/dL (ref 6–20)
CO2: 22 mmol/L (ref 22–32)
Calcium: 8.5 mg/dL — ABNORMAL LOW (ref 8.9–10.3)
Chloride: 102 mmol/L (ref 98–111)
Creatinine, Ser: 1.24 mg/dL (ref 0.61–1.24)
GFR, Estimated: >60 mL/min (ref >60)
Glucose, Bld: 173 mg/dL — ABNORMAL HIGH (ref 70–99)
Potassium: 2.7 mmol/L — CL (ref 3.5–5.1)
Sodium: 135 mmol/L (ref 135–145)
Total Bilirubin: 0.7 mg/dL (ref 0.3–1.2)
Total Protein: 6.7 g/dL (ref 6.5–8.1)

## 2022-04-07 LAB — MAGNESIUM: Magnesium: 2.5 mg/dL — ABNORMAL HIGH (ref 1.7–2.4)

## 2022-04-07 LAB — ETHANOL: Alcohol, Ethyl (B): 78 mg/dL — ABNORMAL HIGH (ref <10)

## 2022-04-07 MED ORDER — NALOXONE HCL 4 MG/0.1ML NA LIQD: 1.0000 | Freq: Once | NASAL | Status: DC

## 2022-04-07 MED ORDER — POTASSIUM CHLORIDE CRYS ER 20 MEQ PO TBCR: 40.0000 meq | EXTENDED_RELEASE_TABLET | Freq: Once | ORAL | Status: DC

## 2022-04-07 NOTE — ED Triage Notes (Signed)
Pt arrived POV stating a lot of negative stuff as brought me here, depression and anxiety. I dont really want to get into all that right now.

## 2022-04-07 NOTE — ED Notes (Signed)
Pt called again to be roomed, no response x2. Checked triage area, pt not found.

## 2022-04-07 NOTE — ED Notes (Signed)
Pt called to be roomed, no response x1

## 2022-04-07 NOTE — ED Provider Triage Note (Signed)
Emergency Medicine Provider Triage Evaluation Note  JOSEY FORCIER , a 51 y.o. male  was evaluated in triage.  Pt complains of depression and anxiety. Use of drugs--last night--smoked cocaine. Reports passive SI x months w/o plan. No HI, AVH. Would like help for drug use.  Review of Systems  Positive: Fatigue, depression, anxiety Negative: AVH  Physical Exam  BP (!) 143/83 (BP Location: Left Arm)   Pulse (!) 115   Temp 98.3 F (36.8 C)   Resp 18   Ht 5\' 8"  (1.727 m)   Wt 99.8 kg   SpO2 95%   BMI 33.45 kg/m  Gen:   Sleepy, no distress   Resp:  Normal effort  MSK:   Moves extremities without difficulty   Medical Decision Making  Medically screening exam initiated at 10:32 AM.  Appropriate orders placed.  Macedonio Scallon Guallpa was informed that the remainder of the evaluation will be completed by another provider, this initial triage assessment does not replace that evaluation, and the importance of remaining in the ED until their evaluation is complete.    Roddie Mc, Pete Pelt 04/07/22 1035

## 2022-04-07 NOTE — ED Notes (Signed)
Spoke with main lab, will add on Magnesium to existing labs.
